# Patient Record
Sex: Female | Born: 1985 | Race: Black or African American | Hispanic: No | Marital: Single | Smoking: Never smoker
Health system: Southern US, Community
[De-identification: ages and names within clinical notes are randomized; demographics above are authoritative.]

## PROBLEM LIST (undated history)

## (undated) ENCOUNTER — Inpatient Hospital Stay (HOSPITAL_COMMUNITY): Payer: Self-pay

## (undated) DIAGNOSIS — F909 Attention-deficit hyperactivity disorder, unspecified type: Secondary | ICD-10-CM

## (undated) DIAGNOSIS — F419 Anxiety disorder, unspecified: Secondary | ICD-10-CM

## (undated) DIAGNOSIS — S0990XA Unspecified injury of head, initial encounter: Secondary | ICD-10-CM

## (undated) DIAGNOSIS — IMO0002 Reserved for concepts with insufficient information to code with codable children: Secondary | ICD-10-CM

## (undated) DIAGNOSIS — F99 Mental disorder, not otherwise specified: Secondary | ICD-10-CM

## (undated) DIAGNOSIS — D649 Anemia, unspecified: Secondary | ICD-10-CM

## (undated) DIAGNOSIS — F1410 Cocaine abuse, uncomplicated: Secondary | ICD-10-CM

## (undated) DIAGNOSIS — F32A Depression, unspecified: Secondary | ICD-10-CM

## (undated) DIAGNOSIS — F3181 Bipolar II disorder: Secondary | ICD-10-CM

## (undated) DIAGNOSIS — F329 Major depressive disorder, single episode, unspecified: Secondary | ICD-10-CM

## (undated) HISTORY — PX: NO PAST SURGERIES: SHX2092

## (undated) HISTORY — PX: OTHER SURGICAL HISTORY: SHX169

---
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
## 2006-03-26 ENCOUNTER — Emergency Department (HOSPITAL_COMMUNITY): Admission: EM | Admit: 2006-03-26 | Discharge: 2006-03-26 | Payer: Self-pay | Admitting: Emergency Medicine

## 2006-04-13 ENCOUNTER — Emergency Department (HOSPITAL_COMMUNITY): Admission: EM | Admit: 2006-04-13 | Discharge: 2006-04-13 | Payer: Self-pay | Admitting: Emergency Medicine

## 2006-04-24 ENCOUNTER — Emergency Department (HOSPITAL_COMMUNITY): Admission: EM | Admit: 2006-04-24 | Discharge: 2006-04-24 | Payer: Self-pay | Admitting: Emergency Medicine

## 2006-04-25 ENCOUNTER — Emergency Department (HOSPITAL_COMMUNITY): Admission: EM | Admit: 2006-04-25 | Discharge: 2006-04-25 | Payer: Self-pay | Admitting: Emergency Medicine

## 2006-05-03 ENCOUNTER — Ambulatory Visit (HOSPITAL_COMMUNITY): Admission: EM | Admit: 2006-05-03 | Discharge: 2006-05-04 | Payer: Self-pay | Admitting: Emergency Medicine

## 2006-06-01 ENCOUNTER — Emergency Department (HOSPITAL_COMMUNITY): Admission: EM | Admit: 2006-06-01 | Discharge: 2006-06-01 | Payer: Self-pay | Admitting: Emergency Medicine

## 2006-07-03 ENCOUNTER — Ambulatory Visit (HOSPITAL_COMMUNITY): Admission: RE | Admit: 2006-07-03 | Discharge: 2006-07-03 | Payer: Self-pay | Admitting: Obstetrics and Gynecology

## 2006-07-10 ENCOUNTER — Ambulatory Visit (HOSPITAL_COMMUNITY): Admission: AD | Admit: 2006-07-10 | Discharge: 2006-07-10 | Payer: Self-pay | Admitting: Obstetrics and Gynecology

## 2006-08-01 ENCOUNTER — Ambulatory Visit (HOSPITAL_COMMUNITY): Admission: EM | Admit: 2006-08-01 | Discharge: 2006-08-01 | Payer: Self-pay | Admitting: Obstetrics and Gynecology

## 2006-09-05 ENCOUNTER — Ambulatory Visit (HOSPITAL_COMMUNITY): Admission: EM | Admit: 2006-09-05 | Discharge: 2006-09-05 | Payer: Self-pay | Admitting: Obstetrics and Gynecology

## 2006-09-27 ENCOUNTER — Observation Stay (HOSPITAL_COMMUNITY): Admission: AD | Admit: 2006-09-27 | Discharge: 2006-09-27 | Payer: Self-pay | Admitting: Obstetrics and Gynecology

## 2006-09-28 ENCOUNTER — Emergency Department (HOSPITAL_COMMUNITY): Admission: EM | Admit: 2006-09-28 | Discharge: 2006-09-28 | Payer: Self-pay | Admitting: Emergency Medicine

## 2006-09-30 ENCOUNTER — Inpatient Hospital Stay (HOSPITAL_COMMUNITY): Admission: AD | Admit: 2006-09-30 | Discharge: 2006-09-30 | Payer: Self-pay | Admitting: Obstetrics & Gynecology

## 2006-09-30 ENCOUNTER — Ambulatory Visit: Payer: Self-pay | Admitting: *Deleted

## 2006-10-01 ENCOUNTER — Ambulatory Visit (HOSPITAL_COMMUNITY): Admission: AD | Admit: 2006-10-01 | Discharge: 2006-10-02 | Payer: Self-pay | Admitting: Obstetrics and Gynecology

## 2006-10-18 ENCOUNTER — Ambulatory Visit (HOSPITAL_COMMUNITY): Admission: AD | Admit: 2006-10-18 | Discharge: 2006-10-18 | Payer: Self-pay | Admitting: Obstetrics and Gynecology

## 2006-10-21 ENCOUNTER — Ambulatory Visit (HOSPITAL_COMMUNITY): Admission: RE | Admit: 2006-10-21 | Discharge: 2006-10-21 | Payer: Self-pay | Admitting: Emergency Medicine

## 2007-02-18 ENCOUNTER — Inpatient Hospital Stay (HOSPITAL_COMMUNITY): Admission: AD | Admit: 2007-02-18 | Discharge: 2007-02-18 | Payer: Self-pay | Admitting: Obstetrics & Gynecology

## 2007-03-08 ENCOUNTER — Inpatient Hospital Stay (HOSPITAL_COMMUNITY): Admission: AD | Admit: 2007-03-08 | Discharge: 2007-03-08 | Payer: Self-pay | Admitting: Gynecology

## 2007-03-12 ENCOUNTER — Inpatient Hospital Stay (HOSPITAL_COMMUNITY): Admission: AD | Admit: 2007-03-12 | Discharge: 2007-03-12 | Payer: Self-pay | Admitting: Gynecology

## 2007-04-02 ENCOUNTER — Inpatient Hospital Stay (HOSPITAL_COMMUNITY): Admission: AD | Admit: 2007-04-02 | Discharge: 2007-04-02 | Payer: Self-pay | Admitting: Family Medicine

## 2007-05-13 ENCOUNTER — Inpatient Hospital Stay (HOSPITAL_COMMUNITY): Admission: AD | Admit: 2007-05-13 | Discharge: 2007-05-13 | Payer: Self-pay | Admitting: Obstetrics & Gynecology

## 2007-06-30 ENCOUNTER — Emergency Department (HOSPITAL_COMMUNITY): Admission: EM | Admit: 2007-06-30 | Discharge: 2007-06-30 | Payer: Self-pay | Admitting: Emergency Medicine

## 2007-07-19 ENCOUNTER — Inpatient Hospital Stay (HOSPITAL_COMMUNITY): Admission: AD | Admit: 2007-07-19 | Discharge: 2007-07-19 | Payer: Self-pay | Admitting: Obstetrics

## 2007-09-11 ENCOUNTER — Inpatient Hospital Stay (HOSPITAL_COMMUNITY): Admission: AD | Admit: 2007-09-11 | Discharge: 2007-09-11 | Payer: Self-pay | Admitting: Obstetrics

## 2007-09-16 ENCOUNTER — Inpatient Hospital Stay (HOSPITAL_COMMUNITY): Admission: AD | Admit: 2007-09-16 | Discharge: 2007-09-18 | Payer: Self-pay | Admitting: Obstetrics

## 2007-11-25 ENCOUNTER — Emergency Department (HOSPITAL_COMMUNITY): Admission: EM | Admit: 2007-11-25 | Discharge: 2007-11-25 | Payer: Self-pay | Admitting: Family Medicine

## 2007-12-30 ENCOUNTER — Inpatient Hospital Stay (HOSPITAL_COMMUNITY): Admission: AD | Admit: 2007-12-30 | Discharge: 2007-12-30 | Payer: Self-pay | Admitting: Obstetrics

## 2008-04-27 ENCOUNTER — Inpatient Hospital Stay (HOSPITAL_COMMUNITY): Admission: AD | Admit: 2008-04-27 | Discharge: 2008-04-27 | Payer: Self-pay | Admitting: Obstetrics

## 2008-05-29 ENCOUNTER — Emergency Department (HOSPITAL_COMMUNITY): Admission: EM | Admit: 2008-05-29 | Discharge: 2008-05-29 | Payer: Self-pay | Admitting: *Deleted

## 2008-05-30 ENCOUNTER — Emergency Department (HOSPITAL_COMMUNITY): Admission: EM | Admit: 2008-05-30 | Discharge: 2008-05-30 | Payer: Self-pay | Admitting: *Deleted

## 2008-06-10 ENCOUNTER — Observation Stay (HOSPITAL_COMMUNITY): Admission: AD | Admit: 2008-06-10 | Discharge: 2008-06-11 | Payer: Self-pay | Admitting: Obstetrics

## 2008-06-27 ENCOUNTER — Inpatient Hospital Stay (HOSPITAL_COMMUNITY): Admission: AD | Admit: 2008-06-27 | Discharge: 2008-06-27 | Payer: Self-pay | Admitting: Obstetrics

## 2008-07-01 ENCOUNTER — Inpatient Hospital Stay (HOSPITAL_COMMUNITY): Admission: AD | Admit: 2008-07-01 | Discharge: 2008-07-01 | Payer: Self-pay | Admitting: Obstetrics & Gynecology

## 2008-07-07 ENCOUNTER — Ambulatory Visit: Payer: Self-pay | Admitting: Obstetrics and Gynecology

## 2008-07-07 ENCOUNTER — Inpatient Hospital Stay (HOSPITAL_COMMUNITY): Admission: AD | Admit: 2008-07-07 | Discharge: 2008-07-07 | Payer: Self-pay | Admitting: Obstetrics & Gynecology

## 2008-07-09 ENCOUNTER — Ambulatory Visit: Payer: Self-pay | Admitting: Obstetrics & Gynecology

## 2008-07-12 ENCOUNTER — Inpatient Hospital Stay (HOSPITAL_COMMUNITY): Admission: AD | Admit: 2008-07-12 | Discharge: 2008-07-14 | Payer: Self-pay | Admitting: Obstetrics and Gynecology

## 2008-07-12 ENCOUNTER — Ambulatory Visit: Payer: Self-pay | Admitting: Obstetrics and Gynecology

## 2008-08-15 ENCOUNTER — Emergency Department (HOSPITAL_COMMUNITY): Admission: EM | Admit: 2008-08-15 | Discharge: 2008-08-15 | Payer: Self-pay | Admitting: Emergency Medicine

## 2008-09-10 ENCOUNTER — Emergency Department (HOSPITAL_COMMUNITY): Admission: EM | Admit: 2008-09-10 | Discharge: 2008-09-11 | Payer: Self-pay | Admitting: Emergency Medicine

## 2009-02-05 ENCOUNTER — Inpatient Hospital Stay (HOSPITAL_COMMUNITY): Admission: AD | Admit: 2009-02-05 | Discharge: 2009-02-05 | Payer: Self-pay | Admitting: Obstetrics & Gynecology

## 2009-09-15 ENCOUNTER — Ambulatory Visit: Payer: Self-pay | Admitting: Obstetrics and Gynecology

## 2009-09-15 ENCOUNTER — Inpatient Hospital Stay (HOSPITAL_COMMUNITY): Admission: AD | Admit: 2009-09-15 | Discharge: 2009-09-15 | Payer: Self-pay | Admitting: Obstetrics & Gynecology

## 2009-10-13 ENCOUNTER — Inpatient Hospital Stay (HOSPITAL_COMMUNITY): Admission: AD | Admit: 2009-10-13 | Discharge: 2009-10-13 | Payer: Self-pay | Admitting: Obstetrics and Gynecology

## 2009-10-14 IMAGING — US US OB FOLLOW-UP
1 series · 14 of 28 positions shown · non-contrast
Comparison: none

OBSTETRICAL ULTRASOUND:
 This ultrasound exam was performed in the [HOSPITAL] Ultrasound Department.  The OB US report was generated in the AS system, and faxed to the ordering physician.  This report is also available in [REDACTED] PACS.

[Series 1: us ob follow-up · 0.33mm/px · 28 acquisitions, 14 frames shown]
[im 2/28]
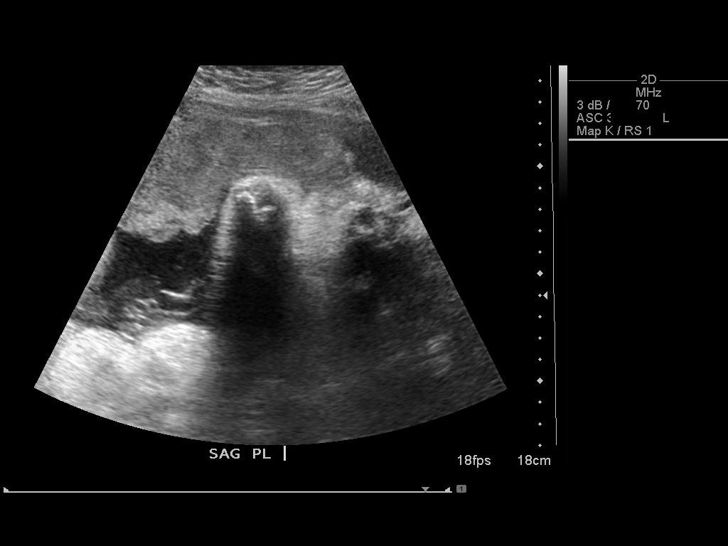
[im 4/28]
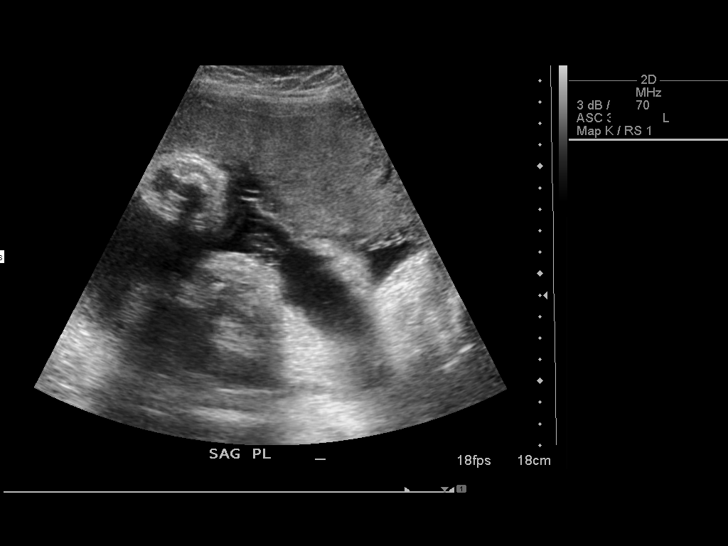
[im 6/28]
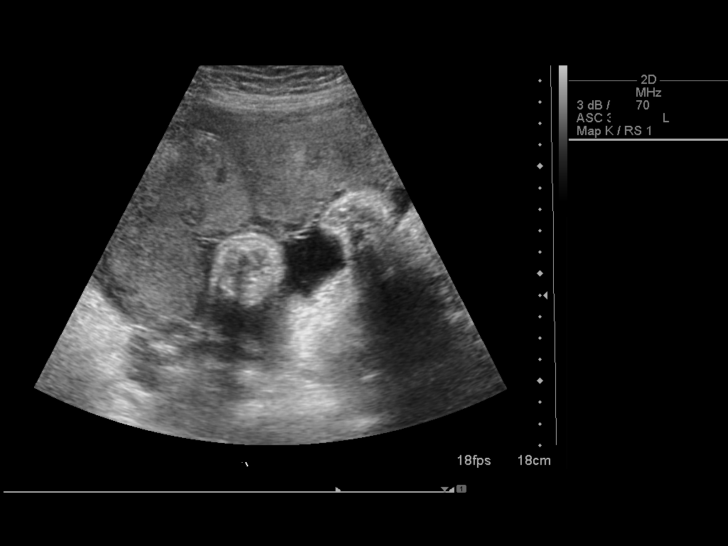
[im 8/28]
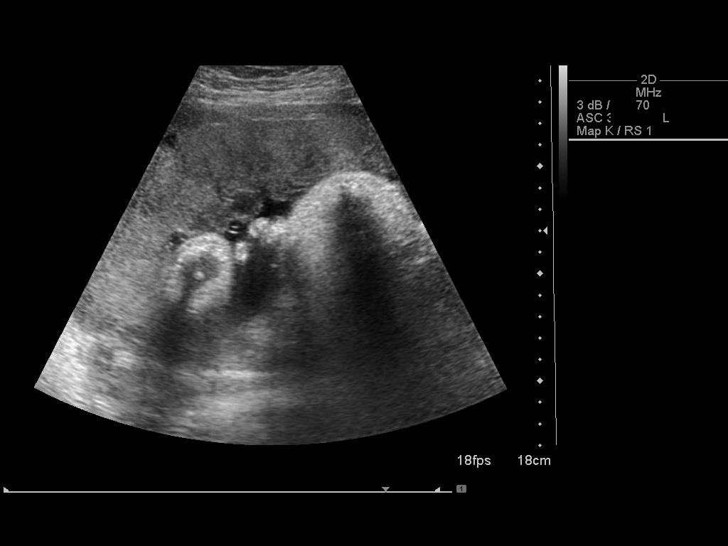
[im 10/28]
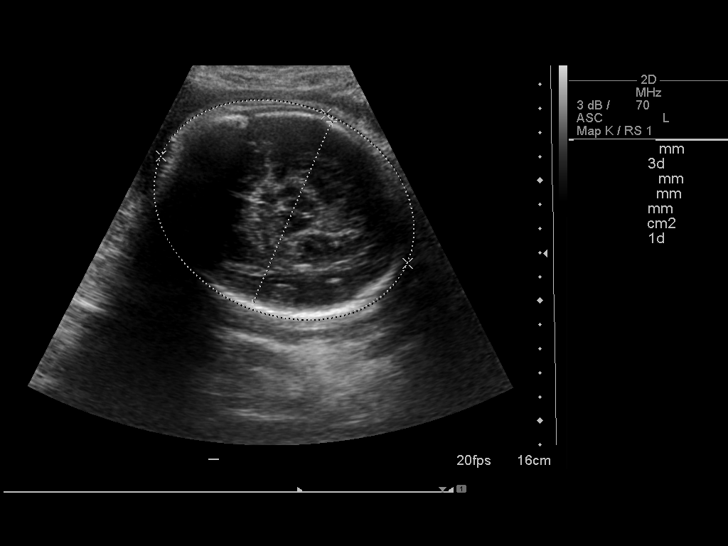
[im 12/28]
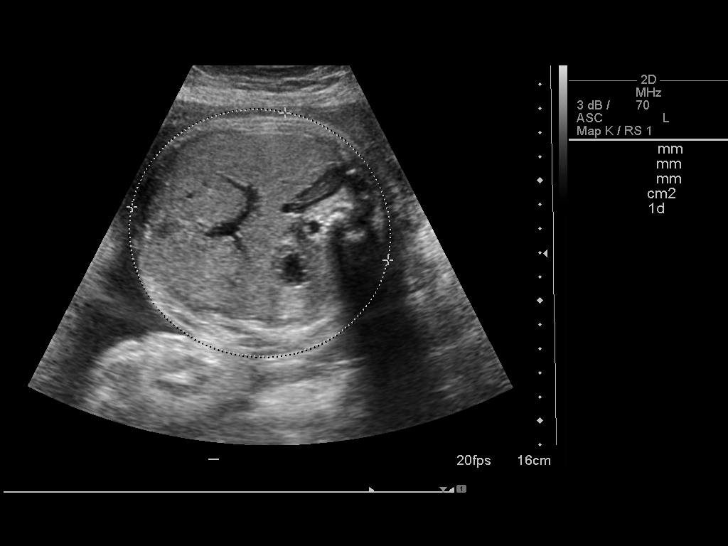
[im 14/28]
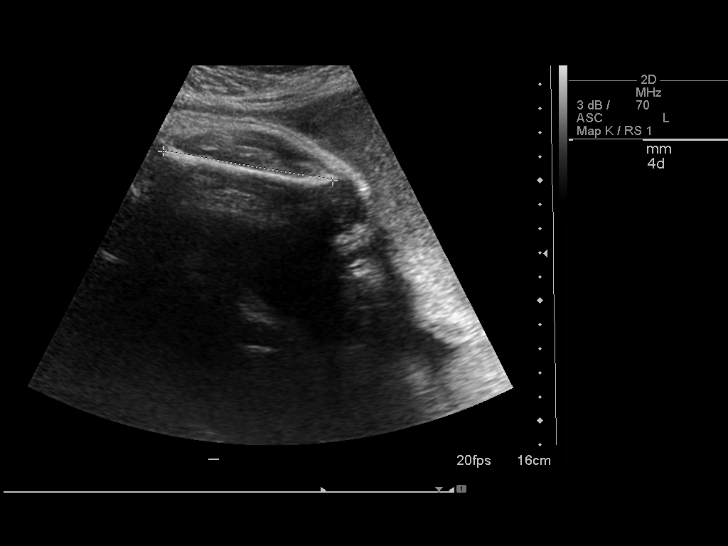
[im 16/28]
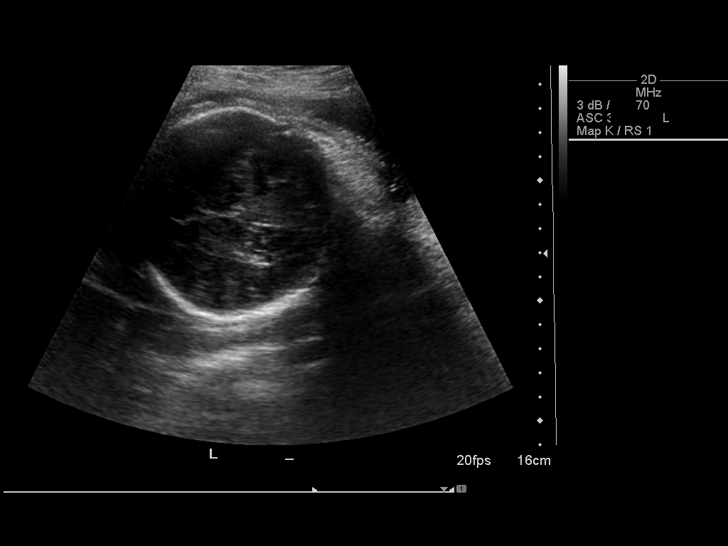
[im 18/28]
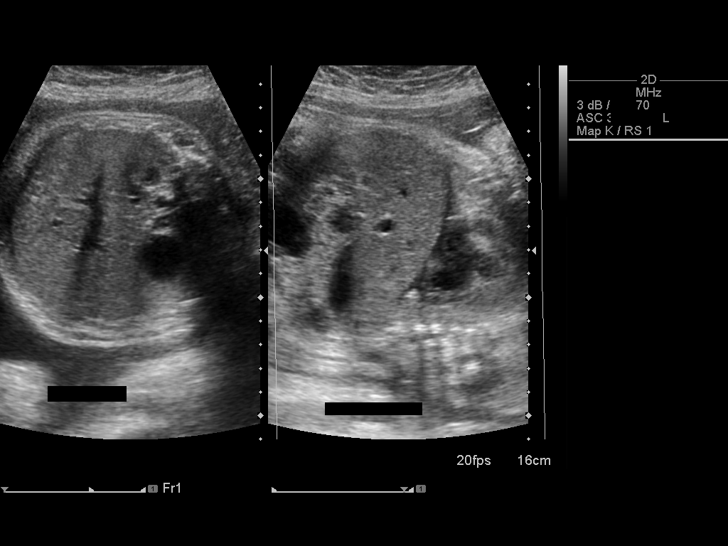
[im 20/28]
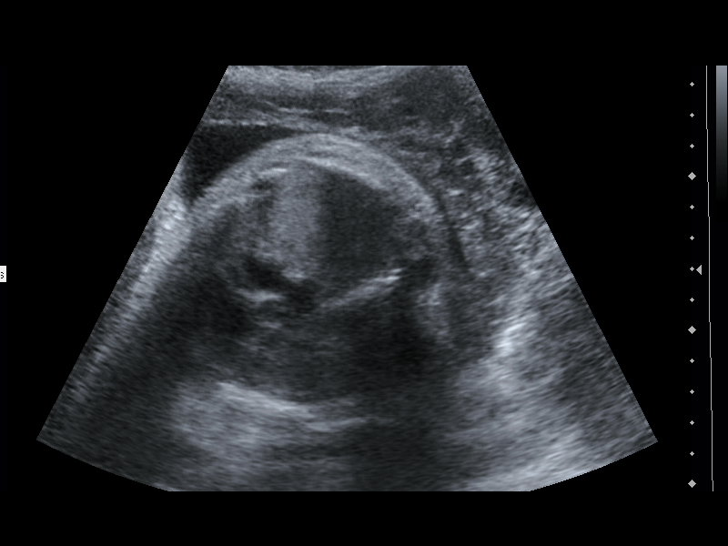
[im 22/28]
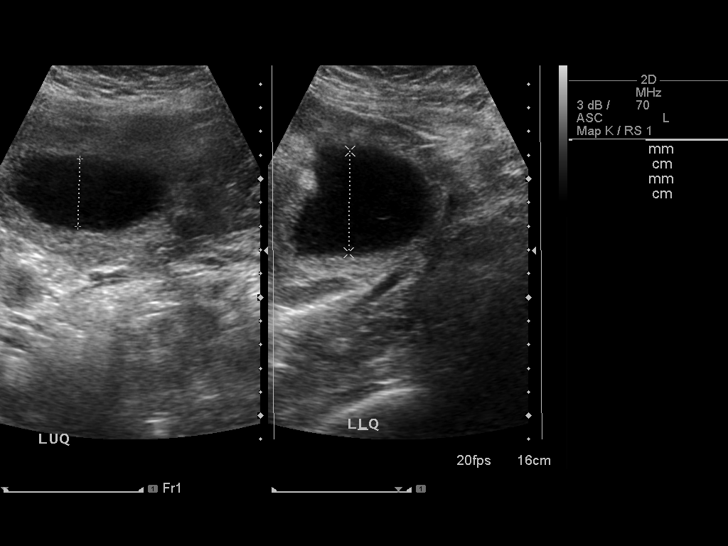
[im 24/28]
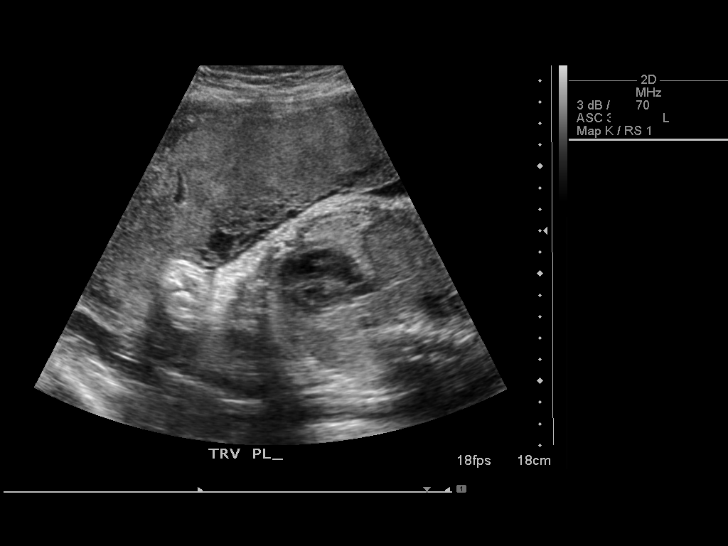
[im 26/28]
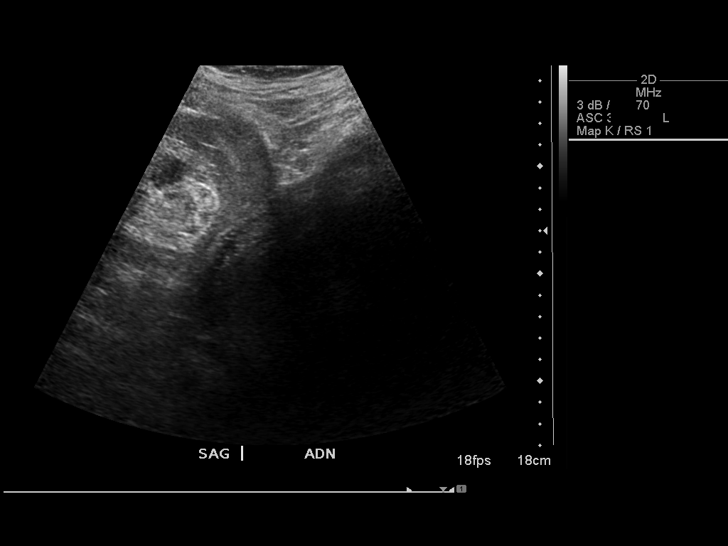
[im 28/28]
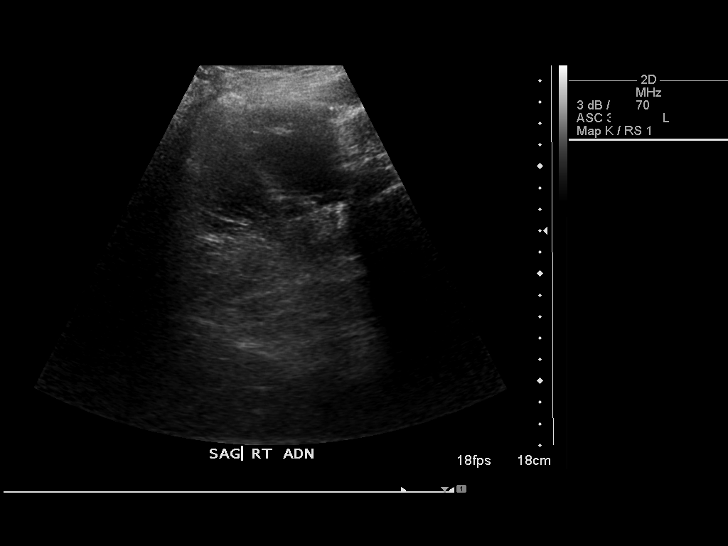

[14 of 28 positions shown; findings below may reference images not displayed]

IMPRESSION: See AS Obstetric US report.

## 2010-10-02 NOTE — L&D Delivery Note (Signed)
Delivery Note At 1:58 AM a viable and healthy female was delivered via Vaginal, Spontaneous Delivery (Presentation: Right Occiput Anterior).  APGAR: 8, 9; weight 8 lb 15.9 oz (4080 g).   Placenta status: Intact, Spontaneous.  Cord: 3 vessels with the following complications: None.    Anesthesia: Epidural  Episiotomy: None Lacerations: None Est. Blood Loss (mL): 350cc  Mom to postpartum.  Baby to nursery-stable. Plans pp BTS  Katheen Aslin E. 05/01/2011, 2:17 AM

## 2010-11-01 ENCOUNTER — Inpatient Hospital Stay (HOSPITAL_COMMUNITY)
Admission: AD | Admit: 2010-11-01 | Discharge: 2010-11-01 | Payer: Self-pay | Source: Home / Self Care | Attending: Family Medicine | Admitting: Family Medicine

## 2010-11-01 DIAGNOSIS — O9989 Other specified diseases and conditions complicating pregnancy, childbirth and the puerperium: Secondary | ICD-10-CM

## 2010-11-01 DIAGNOSIS — R109 Unspecified abdominal pain: Secondary | ICD-10-CM

## 2010-11-01 LAB — URINALYSIS, ROUTINE W REFLEX MICROSCOPIC
Hgb urine dipstick: NEGATIVE
Protein, ur: NEGATIVE mg/dL
Urobilinogen, UA: 0.2 mg/dL (ref 0.0–1.0)

## 2010-11-01 LAB — WET PREP, GENITAL: Yeast Wet Prep HPF POC: NONE SEEN

## 2010-11-03 LAB — GC/CHLAMYDIA PROBE AMP, GENITAL
Chlamydia, DNA Probe: POSITIVE — AB
GC Probe Amp, Genital: POSITIVE — AB

## 2010-12-18 LAB — URINALYSIS, ROUTINE W REFLEX MICROSCOPIC
Protein, ur: NEGATIVE mg/dL
Urobilinogen, UA: 0.2 mg/dL (ref 0.0–1.0)

## 2010-12-18 LAB — CBC
Platelets: 251 10*3/uL (ref 150–400)
RBC: 3.98 MIL/uL (ref 3.87–5.11)
WBC: 11.3 10*3/uL — ABNORMAL HIGH (ref 4.0–10.5)

## 2010-12-18 LAB — WET PREP, GENITAL
Clue Cells Wet Prep HPF POC: NONE SEEN
Trich, Wet Prep: NONE SEEN

## 2010-12-18 LAB — DIFFERENTIAL
Lymphocytes Relative: 17 % (ref 12–46)
Lymphs Abs: 1.9 10*3/uL (ref 0.7–4.0)
Neutrophils Relative %: 74 % (ref 43–77)

## 2010-12-18 LAB — URINE MICROSCOPIC-ADD ON

## 2010-12-18 LAB — GC/CHLAMYDIA PROBE AMP, GENITAL: Chlamydia, DNA Probe: NEGATIVE

## 2011-01-02 ENCOUNTER — Inpatient Hospital Stay (HOSPITAL_COMMUNITY): Payer: Self-pay

## 2011-01-02 ENCOUNTER — Inpatient Hospital Stay (HOSPITAL_COMMUNITY)
Admission: AD | Admit: 2011-01-02 | Discharge: 2011-01-03 | Disposition: A | Discharge status: Home / Self Care | Payer: Self-pay | Source: Ambulatory Visit | Attending: Obstetrics and Gynecology | Admitting: Obstetrics and Gynecology

## 2011-01-02 DIAGNOSIS — O2390 Unspecified genitourinary tract infection in pregnancy, unspecified trimester: Secondary | ICD-10-CM

## 2011-01-02 DIAGNOSIS — A499 Bacterial infection, unspecified: Secondary | ICD-10-CM | POA: Insufficient documentation

## 2011-01-02 DIAGNOSIS — O4690 Antepartum hemorrhage, unspecified, unspecified trimester: Secondary | ICD-10-CM

## 2011-01-02 DIAGNOSIS — N760 Acute vaginitis: Secondary | ICD-10-CM | POA: Insufficient documentation

## 2011-01-02 DIAGNOSIS — B9689 Other specified bacterial agents as the cause of diseases classified elsewhere: Secondary | ICD-10-CM | POA: Insufficient documentation

## 2011-01-03 LAB — RUBELLA SCREEN: Rubella: 25.1 IU/mL — ABNORMAL HIGH

## 2011-01-03 LAB — DIFFERENTIAL
Basophils Absolute: 0 10*3/uL (ref 0.0–0.1)
Lymphocytes Relative: 19 % (ref 12–46)
Monocytes Absolute: 0.7 10*3/uL (ref 0.1–1.0)
Neutro Abs: 8 10*3/uL — ABNORMAL HIGH (ref 1.7–7.7)

## 2011-01-03 LAB — CBC
Hemoglobin: 11.7 g/dL — ABNORMAL LOW (ref 12.0–15.0)
RDW: 13.8 % (ref 11.5–15.5)

## 2011-01-03 LAB — URINALYSIS, ROUTINE W REFLEX MICROSCOPIC
Nitrite: NEGATIVE
Specific Gravity, Urine: 1.025 (ref 1.005–1.030)
Urobilinogen, UA: 0.2 mg/dL (ref 0.0–1.0)

## 2011-01-03 LAB — RAPID URINE DRUG SCREEN, HOSP PERFORMED
Amphetamines: NOT DETECTED
Opiates: NOT DETECTED
Tetrahydrocannabinol: NOT DETECTED

## 2011-01-03 LAB — HEPATITIS B SURFACE ANTIGEN: Hepatitis B Surface Ag: NEGATIVE

## 2011-01-03 LAB — GC/CHLAMYDIA PROBE AMP, GENITAL
Chlamydia, DNA Probe: NEGATIVE
GC Probe Amp, Genital: NEGATIVE

## 2011-01-03 LAB — HIV ANTIBODY (ROUTINE TESTING W REFLEX): HIV: NONREACTIVE

## 2011-01-03 LAB — URINE MICROSCOPIC-ADD ON

## 2011-01-03 LAB — WET PREP, GENITAL: Yeast Wet Prep HPF POC: NONE SEEN

## 2011-01-03 LAB — RPR: RPR Ser Ql: NONREACTIVE

## 2011-01-03 LAB — URINE CULTURE

## 2011-01-09 LAB — ABO/RH: RH Type: POSITIVE

## 2011-01-09 LAB — TYPE AND SCREEN: Antibody Screen: NEGATIVE

## 2011-01-09 LAB — VARICELLA ZOSTER ANTIBODY, IGG

## 2011-01-09 LAB — CBC: Hemoglobin: 12 g/dL (ref 12.0–16.0)

## 2011-01-09 LAB — HIV ANTIBODY (ROUTINE TESTING W REFLEX): HIV: NONREACTIVE

## 2011-02-17 NOTE — Group Therapy Note (Signed)
NAME:  Regina Coleman, SOKOL NO.:  [id number]   MEDICAL RECORD NO.:  [id number]          PATIENT TYPE:  OIB   LOCATION:  LDR1                          FACILITY:  [facility]   PHYSICIAN:  Tilda Burrow, M.D. DATE OF BIRTH:  1986-07-10   DATE OF PROCEDURE:  DATE OF DISCHARGE:  07/10/2006                                   PROGRESS NOTE   [name] is about [redacted] weeks pregnant with her third child, called the office  said she was having a lot of cramping despite drinking plenty of fluids.  She was sent to labor and delivery for evaluation.  She denies using any  cocaine recently, says she did smoke marijuana yesterday and states that it  had to have been laced with cocaine.  However, she did not have any THC in  her urinalysis and this was all brought to the patient's attention yet she  still denies using cocaine knowingly.  Regardless of how it got into her  system, I discussed the implications it can have including placental  abruption with possible death of her and/or her baby.  [name] is  already in a four day a week outpatient rehab program through [facility].  She has an appointment this afternoon.  Her second  child was removed from the home about a month ago for drug use.  Her first  child has also been removed from the home.  We discussed the likelihood of  this baby also being removed from the home if she did not straighten herself  up.  [name] an understanding of all of this.  Her cervix is  firm, long, thick and closed.  No uterine activity is seen and we got a good  tracing on the fetal heart rate.   IMPRESSION:  Intrauterine pregnancy at 22-weeks, positive cocaine abuse,  fetal heart rate within normal limits.  No irritability seen.      [name], C.N.M.      Tilda Burrow, M.D.  Electronically Signed    FC/MEDQ  D:  07/10/2006  T:  07/11/2006  Job:  [id number]   cc:   [facility]  [address] [place], [place] [address]  [place]

## 2011-02-17 NOTE — Op Note (Signed)
NAME:  Regina Coleman, Regina Coleman NO.:  [id number]   MEDICAL RECORD NO.:  [id number]          PATIENT TYPE:  INP   LOCATION:  A401                          FACILITY:  [facility]   PHYSICIAN:  Tilda Burrow, M.D. DATE OF BIRTH:  01-Apr-1986   DATE OF PROCEDURE:  DATE OF DISCHARGE:  12/01/2004                                 OPERATIVE REPORT   PROCEDURE:  Epidural catheter placement.   The patient progressed in labor to active phase labor and requested an  epidural. She was placed in a sitting position, flexed forward, and loss of  resistance technique used after 500 cc bolus. The patient had 1.5% lidocaine  with epinephrine used. This was test dose at 8:37 a.m. on November 29, 2004  followed by 10 cc bolus of epidural mix. We then continued at 12 cc per hour  with continuous infusion. The patient had had sensory deficit changes and  has symmetrical analgesic effect. She progressed nicely to completely  dilated shortly thereafter.      JVF/MEDQ  D:  12/21/2004  T:  12/22/2004  Job:  [id number]

## 2011-02-17 NOTE — Consult Note (Signed)
NAME:  Regina Coleman, Regina Coleman NO.:  [id number]   MEDICAL RECORD NO.:  [id number]          PATIENT TYPE:  OIB   LOCATION:  A415                          FACILITY:  [facility]   PHYSICIAN:  Tilda Burrow, M.D. DATE OF BIRTH:  10-24-1985   DATE OF CONSULTATION:  10/21/2006  DATE OF DISCHARGE:  10/21/2006                                 CONSULTATION   PROCEDURE:  Observation to rule out labor.   [name] presents at 10 a.m. complaining that she is not sure she needs  to get checked but she is uncomfortable.  She has no gush of fluid or  bleeding.  She claims she has been straight, without any use of illicit  substances.  She has an appointment in 2 days with her mental health  counselor.  She is requesting that she have a residence option for  pregnancy.  The plan today is to monitor.  External monitor shows  reactive pattern.  Cervical exam shows the cervix to be 1-2 cm but very  posterior beneath the low presenting part in the vertex.   IMPRESSION:  Physiologic discomforts of late pregnancy.   Routine followup in our office.  Note, we need to confirm that a proof  of cure has been performed on GC, Chl vaginal cultures.      Tilda Burrow, M.D.  Electronically Signed     JVF/MEDQ  D:  10/21/2006  T:  10/21/2006  Job:  [id number]
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
## 2011-02-17 NOTE — Op Note (Signed)
NAME:  LIDIE, GLADE NO.:  [id number]   MEDICAL RECORD NO.:  [id number]          PATIENT TYPE:  INP   LOCATION:  A401                          FACILITY:  [facility]   PHYSICIAN:  Tilda Burrow, M.D. DATE OF BIRTH:  1986/08/25   DATE OF PROCEDURE:  11/29/2004  DATE OF DISCHARGE:                                 OPERATIVE REPORT   DELIVERY NOTE   Immediately after receiving her epidural, Myrtle was noted to be 10 cm  dilated at +2 station. Right prior to her epidural, she had had a  spontaneous rupture of membranes with clear fluid. Zelene pushed for  approximately 45-50 minutes, delivered a viable female infant at 31. The  weight was 6 pounds 4 ounces. Apgars are 9 and 9. Twenty units of Pitocin  diluted in 1,000 cc in lactated ringers was then infused rapidly IV. The  placenta separated spontaneously and delivered via controlled cord traction  at 0950. It was inspected and appears to be intact with a three vessel cord.  The fundus was firm. Minimal blood loss was noted. The vagina was then  inspected, and no lacerations were found. Estimated blood loss 200 cc. The  epidural catheter was then removed, and the blue tip was visualized as being  intact.      JVF/MEDQ  D:  11/29/2004  T:  11/29/2004  Job:  [id number]

## 2011-02-17 NOTE — H&P (Signed)
NAME:  Regina, Coleman NO.:  [id number]   MEDICAL RECORD NO.:  [id number]          PATIENT TYPE:  OIB   LOCATION:  A415                          FACILITY:  [facility]   PHYSICIAN:  Tilda Burrow, M.D. DATE OF BIRTH:  08-12-1986   DATE OF ADMISSION:  08/01/2006  DATE OF DISCHARGE:  LH                                HISTORY & PHYSICAL   HISTORY OF PRESENT ILLNESS:  Regina Coleman is a 25 year old gravida 5, para 2,  abortus 2, who is at [redacted] weeks gestation with sharp, shooting, abdominal  pains.   PAST MEDICAL HISTORY:  Positive for substance abuse.   PAST SURGICAL HISTORY:  Negative.   Also, she has had a psychiatric and diagnosed with bipolar.   FAMILY HISTORY:  Positive for hypertension.   Her prenatal course has been complicated by chronic drug use and positive  Chlamydia which the patient was treated for.  Blood type is A positive.  UDS  is positive for cocaine.  Rubella is immune.  Hepatitis B surface antigen is  negative.  HIV is negative.  Repeat GC and Chlamydia are pending.   PHYSICAL EXAMINATION:  Vital signs are stable.  Fetal heart rate pattern is  stable.  Cervix is firm, long, posterior, and high.  With careful  observation, the patient is noted with fetal movement the patient does have  discomfort.  Also, UDS was obtained and urine is positive for cocaine.  A  lengthy discussion for the second time with Tyesha in regards to cocaine  use and its affects in pregnancy.  She assures me that after today her UDS  will be negative.   PLAN:  We are going to discharge her home to follow up here as needed.  Again, she was counseled for the second time in regards to her drug use.      Zerita Boers, Lanier Clam      Tilda Burrow, M.D.  Electronically Signed    DL/MEDQ  D:  78/29/5621  T:  08/01/2006  Job:  [id number]

## 2011-02-17 NOTE — H&P (Signed)
NAME:  PAMULA, LUTHER NO.:  [id number]   MEDICAL RECORD NO.:  [id number]          PATIENT TYPE:  OIB   LOCATION:  LDR1                          FACILITY:  [facility]   PHYSICIAN:  Tilda Burrow, M.D. DATE OF BIRTH:  October 10, 1985   DATE OF ADMISSION:  11/28/2004  DATE OF DISCHARGE:  LH                                HISTORY & PHYSICAL   OBSERVATION NOTE:   TIME OF ADMISSION:  1320 hours.   Auden is a 25 year old, gravida 2, para 0.  EDC is December 06, 2004.   REASON FOR ADMISSION:  Tewana is a 25 year old, gravida 2, para 0 at 66  weeks pregnancy with complaint of leaking membranes.  Upon admission to the  hospital, she was having no contractions.  The fetal heart rate pattern is  stable.  She is not leaking any fluid upon examination.  Nitrazine is  negative.   MEDICAL HISTORY:  Negative.   SURGICAL HISTORY:  Negative.   ALLERGIES:  There are no known allergies.   MEDICATIONS:  Prenatal vitamins and Valtrex 500 mg p.o. daily for HSV-2  prophylaxis.   FAMILY HISTORY:  Positive for hypertension.   PRENATAL COURSE:  Blood type is A positive.  Rubella is immune.  Hepatitis B  surface antigen is negative.  HIV is negative.  Positive HSV-2.  Serologies  nonreactive.  GC and chlamydia are negative.  GBS is negative.  Sickle cell  screen is negative.  The 28-week hemoglobin was 11 and 28-week hematocrit  35.4.  The one-hour glucose was 90.   PHYSICAL EXAMINATION:  Vital signs stable.  The fetal heart tone is stable.  There are no contractions on the monitor.  Sterile speculum exam reveals no  leaking with visualization of the cervix and cough.  Nitrazine is negative.  Fern test is negative.  There is no active leaking with cough or bearing  down.  The cervix is 170, -1 and -2.  The presenting parts ballot.   PLAN:  We are going to discharge home with reactive nonstress test.  Also,  after discussing her medication in regards to whether she is being compliant  with her Valtrex, she said she has not taken it in three days.  I discussed  with her the importance of taking her Valtrex and being compliant.  There  are no lesions upon inspection.  I did give her today's dose of Valtrex here  at the hospital and encouraged her to be compliant with her medication  regimen.      DL/MEDQ  D:  81/19/1478  T:  11/28/2004  Job:  [id number]   cc:   [facility]

## 2011-02-17 NOTE — H&P (Signed)
NAME:  Regina Coleman, Regina Coleman             ACCOUNT NO.:  [id number]   MEDICAL RECORD NO.:  [id number]          PATIENT TYPE:  OIB   LOCATION:  A415                          FACILITY:  [facility]   PHYSICIAN:  Tilda Burrow, M.D. DATE OF BIRTH:  Nov 10, 1985   DATE OF ADMISSION:  07/03/2006  DATE OF DISCHARGE:  LH                                HISTORY & PHYSICAL   OBSERVATION ADMISSION:   REASON FOR ADMISSION:  Pregnancy at 22 weeks with uterine irritability.   Quina presents at 4 a.m. with some mild cramping. Urine shows dehydration  with ketones present. After adequate hydration, the patient's contractions  abated and the patient went to sleep. The cervix is closed, firm, posterior  and high.   PLAN:  We are going to discharge her home. I gave her information and  encouraged her to drink plenty of fluids that dehydration would cause  cramping. She was to followup in the office with [place] one day this week.      Zerita Boers, Lanier Clam      Tilda Burrow, M.D.  Electronically Signed    DL/MEDQ  D:  04/54/0981  T:  07/03/2006  Job:  [id number]

## 2011-02-17 NOTE — H&P (Signed)
NAME:  Regina Coleman, VAZQUES NO.:  [id number]   MEDICAL RECORD NO.:  [id number]          PATIENT TYPE:  INP   LOCATION:  LDR1                          FACILITY:  [facility]   PHYSICIAN:  Tilda Burrow, M.D. DATE OF BIRTH:  Oct 28, 1985   DATE OF ADMISSION:  11/28/2004  DATE OF DISCHARGE:  LH                                HISTORY & PHYSICAL   ADMISSION DIAGNOSIS:  Pregnancy at 39 weeks for elective induction of labor  at the patient's request.   After thoroughly discussing options versus benefits versus risks of  induction with the patient and the patient's mother, the patient desires  elective induction of labor due to maternal discomfort and inability to  sleep.   MEDICAL HISTORY:  Negative.   SURGICAL HISTORY:  Negative.   ALLERGIES:  She has no known allergies.   MEDICATIONS:  She is on prenatal vitamins and Valtrex, although she does  admit for the past several days that she has not taken her Valtrex.  She was  given a dose today in labor and delivery.   SOCIAL HISTORY:  She lives with her grandmother.   FAMILY HISTORY:  Positive for hypertension.   Prenatal course has been essentially uneventful, except she is positive for  HSV2.  Blood type is A positive.  UDS is negative.  Rubella is immune.  Hepatitis B surface antigen negative.  HIV negative.  HSV2 is positive.  Serology is nonreactive.  GC and chlamydia are negative.  GBS is negative.  The 28-week hemoglobin was 11.0 and the 28-week hematocrit was 35.9.  The  one-hour glucose was 96.  Sickle cell screen is negative.   PHYSICAL EXAMINATION:  On physical exam today, the vital signs are stable.  The cervix is 1 cm, 70% effaced and -1 to -2 station.   After discussing the plan of care with Dr. Despina Hidden, it was felt that  __prepidil_ would be an appropriate plan of care for the patient in regards  to inducing labor.  Plan to admit, use __________ for induction and start  Pitocin at 4 a.m.     DL/MEDQ  D:   04/54/0981  T:  11/28/2004  Job:  [id number]

## 2011-02-17 NOTE — Consult Note (Signed)
NAME:  Regina Coleman, Regina Coleman NO.:  [id number]   MEDICAL RECORD NO.:  [id number]          PATIENT TYPE:  OBV   LOCATION:  LDR3                          FACILITY:  [facility]   PHYSICIAN:  Tilda Burrow, M.D. DATE OF BIRTH:  January 04, 1986   DATE OF CONSULTATION:  DATE OF DISCHARGE:  09/27/2006                                 CONSULTATION   CHIEF COMPLAINT:  Lower abdominal discomfort, suprapubic area.   HISTORY OF PRESENT ILLNESS:  This 25 year old female, gravida 5, para 2,  AB 2, two living children currently in the custody of her aunt, is seen  in labor and delivery complaining of suprapubic pain and discomfort.  The pregnancy course has been fragmented with missed appointments and  travel to [place] and to [place].  Patient has had no bleeding or  gush of fluids.  External monitoring shows a reactive pattern with no  decelerations.  Cervix is 1 cm long and high with a thinning part  ballotable by initial nursing assessment.  She is afebrile.   Assessment so far shows urine drug screen positive for cocaine once  again with glucose present as well.  There is no evidence of urinary  tract infection with negative nitrate and trace protein on this highly  concentrated specimen.  Leukocyte esterase is indeed trace positive on  the concentrated specimen.   On further review of systems, the patient has recently been retreated  for her second infection of this pregnancy, and she has been abstinent  for four weeks.  Patient has an appointment next week.  At next week's  appointment, we can test for proof of cure.   Further discussion with patient over her cocaine use.  The patient is  concerned over _risk of__ losing her child.  She expresses some interest  in going to a rehab program but does not want to go through any  pregnancy if it will increase her likelihood of losing the child.  She  considered that she may go sometime after the pregnancy.  I made the  point of  telling her that attending a rehab program at this point in her  pregnancy would most likely improve chances that she will be able to  keep her child rather than reduce it.  She is aware that documents of  cocaine use have been made a part of her record twice so far.   IMPRESSION:  1. Nonspecific pain of pregnancy, no evidence of rupture or preterm      labor.  2. Recurrent cocaine use.  3. Recent treated sexually transmitted diseases x2.   PLAN:  Will allow patient to go home on Ambien CR 12.5 mg prior to  discharge.  Follow up in one week with [facility]; however, we will be  considering attempting to help with a rehab program if the patient is  consistent in her expressed interest.   ADDENDUM:  Patient describes herself as bipolar and ADHD with history of  psychiatric commitment in 2005 at [facility].      Tilda Burrow, M.D.  Electronically Signed     JVF/MEDQ  D:  09/27/2006  T:  09/27/2006  Job:  [id number]

## 2011-02-17 NOTE — Discharge Summary (Signed)
NAME:  Regina Coleman, Regina Coleman NO.:  [id number]   MEDICAL RECORD NO.:  [id number]          PATIENT TYPE:  OIB   LOCATION:  A415                          FACILITY:  [facility]   PHYSICIAN:  Tilda Burrow, M.D. DATE OF BIRTH:  04-08-1986   DATE OF ADMISSION:  10/01/2006  DATE OF DISCHARGE:  01/01/2008LH                               DISCHARGE SUMMARY   REASON FOR ADMISSION:  Pregnancy at 34 weeks and 4 days with premature  uterine contractions.   DISCHARGE DIAGNOSIS:  Pregnancy at 34 weeks and 4 days with premature  uterine contractions.   HOSPITAL COURSE:  The patient was given subcutaneous terbutaline and  p.o. terbutaline which abated the uterine contractions after about 1-1/2  hours.  She has rested well during the course of the night with only an  occasional isolated uterine contraction.  I discussed with the patient  the importance of not using street drugs, but the patient has  consistently done cocaine and prescription narcotics that she could get  her hands on throughout the pregnancy.  Also, last night, she refused  her azithromycin.  I had a discussion with her.  She is willing to take  them now.  I am going to discharge her on Valtrex 1 gm p.o. daily and  Brethine 5 mg p.o. q. 4 hours.  On admit when she is in labor,we will  get an HIV, a Hep B and C with her admitting blood work.      Zerita Boers, Lanier Clam      Tilda Burrow, M.D.  Electronically Signed    DL/MEDQ  D:  19/14/7829  T:  10/02/2006  Job:  [id number]   cc:   [facility]

## 2011-02-17 NOTE — H&P (Signed)
NAME:  Regina Coleman, Regina Coleman NO.:  [id number]   MEDICAL RECORD NO.:  [id number]          PATIENT TYPE:  OIB   LOCATION:  A415                          FACILITY:  [facility]   PHYSICIAN:  Tilda Burrow, M.D. DATE OF BIRTH:  [birth year]   DATE OF ADMISSION:  10/01/2006  DATE OF DISCHARGE:  LH                              HISTORY & PHYSICAL   OBSERVATION ADMISSION:   REASON FOR ADMISSION:  Pregnancy at 34 weeks and about 3-4 days with  regular uterine contractions without cervical change on admission since  prior exam the day before.   Medical history is positive for psychiatric problems.  There was  commitment in 2005, diagnosed with bipolar.  Also she has a history of  cocaine and Rx drug abuse.   PAST SURGICAL HISTORY:  Negative.   ALLERGIES:  She has no known allergies.   MEDICATIONS:  Prenatal vitamins.   SOCIAL HISTORY:  She is bisexual.  She has a girlfriend.  She is  estranged from the baby's father at the present time.   FAMILY HISTORY:  Positive for hypertension.   Prenatal course has been complicated by limited prenatal care.  The  patient has had intermittent visits, states that at one time she was in  [place] for a couple of weeks between 09/07 and 12/04.  She has had  several visits since 12/04.   PRENATAL COURSE:  She is positive for Chlamydia, which was treated.  Blood type is A positive.  UDS was positive cocaine.  Rubella is immune.  Hepatitis B surface antigen is negative.  HIV is nonreactive.  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  The patient is going to be given 1 g of Valtrex p.o. here  tonight and she will be sent home with a prescription for Valtrex 1 g  p.o. daily.  Initial Pap was normal.  GC/Chlamydia:  Chlamydia was  positive.  On repeat was positive on the [facility], for which she was treated  on 12/07.  A 28-week hemoglobin was 13.0, 28-week hematocrit was 38.3.  One-hour glucose was 141.   PHYSICAL EXAMINATION:  Vital signs are stable.  She is having  regular  uterine contractions every 3-4 minutes; they are very mild.  Fetal heart  rate is stable at the present time.  Abdomen is soft, gravid,  hypertension and nonrigid.   UDS was obtained, and it is positive for cocaine.  Regular urinalysis is  pending.   PLAN:  We are going to give her subcu terbutaline followed by 5 mg p.o.,  Brethine 5 p.o. q.4h., 1 g of Valtrex p.o., and observe.  If she is  stable, will discharge her home in the morning.      Zerita Boers, Lanier Clam      Tilda Burrow, M.D.  Electronically Signed    DL/MEDQ  D:  16/07/9603  T:  10/01/2006  Job:  [id number]
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
## 2011-02-17 NOTE — Group Therapy Note (Signed)
NAME:  Regina Coleman, Regina Coleman NO.:  [id number]   MEDICAL RECORD NO.:  [id number]          PATIENT TYPE:  OIB   LOCATION:  A415                          FACILITY:  [facility]   PHYSICIAN:  Tilda Burrow, M.D. DATE OF BIRTH:  06-04-1986   DATE OF PROCEDURE:  DATE OF DISCHARGE:  10/18/2006                                 PROGRESS NOTE   [name] is a 25 year old gravida 3, para 2 at 54-weeks gestation who  came in with complaints of feeling that the baby has dropped. She is not  having any complaints of contractions. She was placed on the monitor,  the fetal heart rate looks fine. She is not having any contractions. A  urinalysis is pending. She says if there are any drugs in her urine this  time that something is wrong. She does have a history of cocaine abuse  throughout this pregnancy.   We are going to continue to monitor and wait for urinalysis and then  send her home if everything remains fine.   Her cervix is 1-2 cm, very posterior, 50% effaced and -1 station.      [name], C.N.M.      Tilda Burrow, M.D.  Electronically Signed    FC/MEDQ  D:  10/18/2006  T:  10/18/2006  Job:  [id number]   cc:   [facility]
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
## 2011-02-21 ENCOUNTER — Inpatient Hospital Stay (HOSPITAL_COMMUNITY)
Admission: AD | Admit: 2011-02-21 | Discharge: 2011-02-21 | Disposition: A | Discharge status: Home / Self Care | Payer: Self-pay | Source: Ambulatory Visit | Attending: Obstetrics & Gynecology | Admitting: Obstetrics & Gynecology

## 2011-02-21 DIAGNOSIS — N390 Urinary tract infection, site not specified: Secondary | ICD-10-CM | POA: Insufficient documentation

## 2011-02-21 DIAGNOSIS — O2390 Unspecified genitourinary tract infection in pregnancy, unspecified trimester: Secondary | ICD-10-CM | POA: Insufficient documentation

## 2011-02-21 DIAGNOSIS — J309 Allergic rhinitis, unspecified: Secondary | ICD-10-CM | POA: Insufficient documentation

## 2011-02-21 DIAGNOSIS — O4700 False labor before 37 completed weeks of gestation, unspecified trimester: Secondary | ICD-10-CM | POA: Insufficient documentation

## 2011-02-21 LAB — URINALYSIS, ROUTINE W REFLEX MICROSCOPIC
Glucose, UA: NEGATIVE mg/dL
Specific Gravity, Urine: 1.02 (ref 1.005–1.030)
pH: 6.5 (ref 5.0–8.0)

## 2011-02-21 LAB — URINE MICROSCOPIC-ADD ON

## 2011-02-22 LAB — URINE CULTURE

## 2011-02-23 LAB — GC/CHLAMYDIA PROBE AMP, GENITAL: Chlamydia, DNA Probe: POSITIVE — AB

## 2011-03-12 ENCOUNTER — Inpatient Hospital Stay (HOSPITAL_COMMUNITY)
Admission: AD | Admit: 2011-03-12 | Discharge: 2011-03-12 | Disposition: A | Discharge status: Home / Self Care | Payer: Self-pay | Source: Ambulatory Visit | Attending: Obstetrics & Gynecology | Admitting: Obstetrics & Gynecology

## 2011-03-12 DIAGNOSIS — O4700 False labor before 37 completed weeks of gestation, unspecified trimester: Secondary | ICD-10-CM | POA: Insufficient documentation

## 2011-03-12 LAB — URINALYSIS, ROUTINE W REFLEX MICROSCOPIC
Glucose, UA: NEGATIVE mg/dL
Hgb urine dipstick: NEGATIVE
Leukocytes, UA: NEGATIVE
Protein, ur: NEGATIVE mg/dL
Specific Gravity, Urine: 1.015 (ref 1.005–1.030)

## 2011-03-12 LAB — WET PREP, GENITAL: Trich, Wet Prep: NONE SEEN

## 2011-03-12 LAB — FETAL FIBRONECTIN: Fetal Fibronectin: NEGATIVE

## 2011-03-14 LAB — GC/CHLAMYDIA PROBE AMP, URINE: GC Probe Amp, Urine: NEGATIVE

## 2011-03-20 LAB — GC/CHLAMYDIA PROBE AMP, GENITAL: Chlamydia: NEGATIVE

## 2011-04-06 ENCOUNTER — Emergency Department (HOSPITAL_COMMUNITY)
Admission: EM | Admit: 2011-04-06 | Discharge: 2011-04-06 | Disposition: A | Discharge status: Home / Self Care | Payer: Medicaid Other | Attending: Emergency Medicine | Admitting: Emergency Medicine

## 2011-04-06 DIAGNOSIS — O4700 False labor before 37 completed weeks of gestation, unspecified trimester: Secondary | ICD-10-CM | POA: Insufficient documentation

## 2011-04-11 ENCOUNTER — Inpatient Hospital Stay (HOSPITAL_COMMUNITY): Admission: AD | Admit: 2011-04-11 | Payer: Self-pay | Source: Ambulatory Visit | Admitting: Obstetrics & Gynecology

## 2011-04-11 ENCOUNTER — Encounter (HOSPITAL_COMMUNITY): Payer: Self-pay

## 2011-04-11 ENCOUNTER — Inpatient Hospital Stay (HOSPITAL_COMMUNITY)
Admission: AD | Admit: 2011-04-11 | Discharge: 2011-04-11 | Disposition: A | Discharge status: Home / Self Care | Payer: Medicaid Other | Source: Ambulatory Visit | Attending: Obstetrics & Gynecology | Admitting: Obstetrics & Gynecology

## 2011-04-11 DIAGNOSIS — O99891 Other specified diseases and conditions complicating pregnancy: Secondary | ICD-10-CM | POA: Insufficient documentation

## 2011-04-11 DIAGNOSIS — O9989 Other specified diseases and conditions complicating pregnancy, childbirth and the puerperium: Secondary | ICD-10-CM

## 2011-04-11 DIAGNOSIS — N949 Unspecified condition associated with female genital organs and menstrual cycle: Secondary | ICD-10-CM

## 2011-04-11 DIAGNOSIS — Z3480 Encounter for supervision of other normal pregnancy, unspecified trimester: Secondary | ICD-10-CM

## 2011-04-11 HISTORY — DX: Mental disorder, not otherwise specified: F99

## 2011-04-11 HISTORY — DX: Attention-deficit hyperactivity disorder, unspecified type: F90.9

## 2011-04-11 HISTORY — DX: Reserved for concepts with insufficient information to code with codable children: IMO0002

## 2011-04-11 HISTORY — DX: Bipolar II disorder: F31.81

## 2011-04-11 NOTE — Progress Notes (Signed)
Pt states she had sex last night and had a thick mucus d/c today. Was seen at [facility] and told she had a POS Fern, and was 3.5 cm. Pt was sent to MAU for evaluation.

## 2011-04-11 NOTE — ED Provider Notes (Signed)
History     Chief Complaint  Patient presents with  . Back Pain   HPI   25  Yr old black female, Gravida 34,    Para 67 at 35 weeks presents with complaint of liquid mucous soaking her clothing today. Did have intercourse last night. States was examined at [facility] and told she had ferning. Wants to deliver now.  Denies contractions or bleeding.   Past Medical History  Diagnosis Date  . No pertinent past medical history   . Genital herpes     Also Chlamydia, GC, HPV and Trich  . Mental disorder   . Bipolar 2 disorder   . ADHD (attention deficit hyperactivity disorder)   . Abnormal Pap smear     during preg, will retest after  . Abnormal Pap smear and cervical HPV (human papillomavirus)      Past Surgical History  Procedure Date  . No past surgeries     Family History  Problem Relation Age of Onset  . Diabetes Paternal Grandmother    History   Social History  . Marital Status: Single    Spouse Name: N/A    Number of Children: N/A  . Years of Education: N/A   Occupational History  . Not on file.   Social History Main Topics  . Smoking status: Never Smoker   . Smokeless tobacco: Never Used  . Alcohol Use: No  . Drug Use: Yes    Special: Cocaine     Last admitted to use in January 2012  . Sexually Active: Yes     07/09 >>>  wants to use Implanon after delivery   Other Topics Concern  . Not on file   Social History Narrative  . No narrative on file    Allergies: No Known Allergies  No prescriptions prior to admission    Review of Systems  Constitutional: Negative for fever.  Genitourinary:       Vaginal discharge    Physical Exam   Blood pressure 113/64, pulse 101, temperature 97.8 F (36.6 C), temperature source Oral, resp. rate 20, height 5\' 4"  (1.626 m), weight 105.053 kg (231 lb 9.6 oz).  Physical Exam  Constitutional: She appears well-developed and well-nourished.  HENT:  Head: Normocephalic.  Neck: Normal range of motion.    Cardiovascular: Normal rate.   Respiratory: Effort normal.  GI: Soft. She exhibits no distension.  Genitourinary: Uterus normal. Vaginal discharge found.       Speculum exam showed milky white mucous, no pooling, no ferning. Cervix tight 3/50/-3/posterior.  EFM reactive FHR with rare contraction (none in 30 min)  Neurological: She is alert.  Skin: Skin is warm and dry.    MAU Course  Procedures  A:  IUP @ 35 weeks Vaginal discharge, no evidence of PPROM Not in labor  P:  D/C home Labor precautions given

## 2011-04-12 ENCOUNTER — Inpatient Hospital Stay (HOSPITAL_COMMUNITY): Admission: EM | Admit: 2011-04-12 | Payer: Self-pay | Source: Ambulatory Visit | Admitting: Obstetrics and Gynecology

## 2011-04-12 ENCOUNTER — Inpatient Hospital Stay (HOSPITAL_COMMUNITY)
Admission: EM | Admit: 2011-04-12 | Discharge: 2011-04-12 | Disposition: A | Discharge status: Home / Self Care | Payer: Medicaid Other | Source: Ambulatory Visit | Attending: Obstetrics and Gynecology | Admitting: Obstetrics and Gynecology

## 2011-04-12 ENCOUNTER — Encounter (HOSPITAL_COMMUNITY): Payer: Self-pay

## 2011-04-12 DIAGNOSIS — O9989 Other specified diseases and conditions complicating pregnancy, childbirth and the puerperium: Secondary | ICD-10-CM

## 2011-04-12 DIAGNOSIS — O99891 Other specified diseases and conditions complicating pregnancy: Secondary | ICD-10-CM | POA: Insufficient documentation

## 2011-04-12 DIAGNOSIS — Z3480 Encounter for supervision of other normal pregnancy, unspecified trimester: Secondary | ICD-10-CM

## 2011-04-12 LAB — WET PREP, GENITAL: Yeast Wet Prep HPF POC: NONE SEEN

## 2011-04-12 LAB — POCT FERN TEST: Fern Test: NEGATIVE

## 2011-04-12 LAB — AMNISURE RUPTURE OF MEMBRANE (ROM) NOT AT ARMC: Amnisure ROM: NEGATIVE

## 2011-04-12 MED ORDER — ACETAMINOPHEN 500 MG PO TABS
1000.0000 mg | ORAL_TABLET | Freq: Once | ORAL | Status: AC
  Administered 2011-04-12: 1000 mg via ORAL
  Filled 2011-04-12: qty 2

## 2011-04-12 NOTE — Progress Notes (Signed)
Pt states she was seen in MAU 7-10 to R/O ROM. Was evaluated and sent home. Pt states she continues to have leaking, not wearing a pad but her panties are wet with what appears to be clear fluid. Pt given a pad to wear.

## 2011-04-12 NOTE — ED Provider Notes (Signed)
Chief Complaint:  Contractions   Regina Coleman is  25 y.o. [phone] at [redacted]w[redacted]d presents complaining of leakage of fluid.  Pt was seen here yesterday with pos fern s/p recent intercourse.  She was ruled out for rupture of membranes, but still c/o leakage of clear fluid. She states irregular, every 10 minutes associated with no vaginal bleeding, clear fluid, ?ROM, along with active Fetal movement.  She also notes some cramping with lower abdominal pressure.   Obstetrical/Gynecological History: OB History    Grav Para Term Preterm Abortions TAB SAB Ect Mult Living   9 6 6  0 2 0 2 0 0 6     H/o HSV not on any meds, denies any current symptoms, abnormal pap this pregnancy to be repeated pp, denies any STIs  Past Medical History: Past Medical History  Diagnosis Date  . Genital herpes     Also Chlamydia, GC, HPV and Trich  . Mental disorder   . Bipolar 2 disorder   . ADHD (attention deficit hyperactivity disorder)   . Abnormal Pap smear     during preg, will retest after  . Abnormal Pap smear and cervical HPV (human papillomavirus)     Past Surgical History: Past Surgical History  Procedure Date  . No past surgeries     Family History: Family History  Problem Relation Age of Onset  . Diabetes Paternal Grandmother   . Heart disease Paternal Grandmother   . Hypertension Paternal Grandmother     Social History: History  Substance Use Topics  . Smoking status: Never Smoker   . Smokeless tobacco: Never Used  . Alcohol Use: No    Allergies: No Known Allergies  Prescriptions prior to admission  Medication Sig Dispense Refill  . prenatal vitamin w/FE, FA (PRENATAL 1 + 1) 27-1 MG TABS Take 1 tablet by mouth daily.          Review of Systems - Negative except per HPI  Physical Exam   Blood pressure 118/69, pulse 97, temperature 98.1 F (36.7 C), temperature source Oral, resp. rate 20, height 5\' 3"  (1.6 m), weight 105.053 kg (231 lb 9.6 oz), SpO2 96.00%.  General:  General appearance - alert, well appearing, and in no distress Chest - clear to auscultation, no wheezes, rales or rhonchi, symmetric air entry Heart - normal rate, regular rhythm, normal S1, S2, no murmurs, rubs, clicks or gallops Abdomen - soft, nontender, nondistended, no masses or organomegaly Gravid, size cwd, vertex by leopolds, EFW 6-7lbs by leopolds Pelvic - normal external genitalia, vulva, vagina, cervix, uterus and adnexa Extremities - peripheral pulses normal, no pedal edema, no clubbing or cyanosis Skin - normal coloration and turgor, no rashes, no suspicious skin lesions noted Baseline: 140 bpm, Variability: Good {> 6 bpm), Accelerations: Reactive and Decelerations: Absent None Cx: 2-3/50/-3, fern neg    Labs: No results found for this or any previous visit (from the past 24 hour(s)). Imaging Studies:  No results found.   Assessment: WESLEE PRESTAGE is  25 y.o. [id number] at 107w1d presents with r/o rupture of membranes.  Plan: amnisure negative, fern neg, toco without contractions, FHTs-reactive.  D/c home with PTL warnings. RTC in one week for eval.    Nohlan Burdin,LACHELLE7/11/20122:02 PM

## 2011-04-13 LAB — GC/CHLAMYDIA PROBE AMP, GENITAL: GC Probe Amp, Genital: NEGATIVE

## 2011-04-21 ENCOUNTER — Ambulatory Visit (HOSPITAL_COMMUNITY): Admission: RE | Admit: 2011-04-21 | Payer: Self-pay | Source: Ambulatory Visit

## 2011-04-21 ENCOUNTER — Other Ambulatory Visit: Payer: Self-pay | Admitting: Family Medicine

## 2011-04-21 DIAGNOSIS — O3660X0 Maternal care for excessive fetal growth, unspecified trimester, not applicable or unspecified: Secondary | ICD-10-CM

## 2011-04-25 ENCOUNTER — Ambulatory Visit (HOSPITAL_COMMUNITY)
Admission: RE | Admit: 2011-04-25 | Discharge: 2011-04-25 | Disposition: A | Discharge status: Home / Self Care | Payer: Medicaid Other | Source: Ambulatory Visit | Attending: Family Medicine | Admitting: Family Medicine

## 2011-04-25 ENCOUNTER — Ambulatory Visit (HOSPITAL_COMMUNITY): Admission: RE | Admit: 2011-04-25 | Payer: Self-pay | Source: Ambulatory Visit

## 2011-04-25 DIAGNOSIS — F1920 Other psychoactive substance dependence, uncomplicated: Secondary | ICD-10-CM | POA: Insufficient documentation

## 2011-04-25 DIAGNOSIS — F1410 Cocaine abuse, uncomplicated: Secondary | ICD-10-CM | POA: Insufficient documentation

## 2011-04-25 DIAGNOSIS — O3660X0 Maternal care for excessive fetal growth, unspecified trimester, not applicable or unspecified: Secondary | ICD-10-CM | POA: Insufficient documentation

## 2011-04-28 LAB — STREP B DNA PROBE: GBS: POSITIVE

## 2011-04-30 ENCOUNTER — Encounter (HOSPITAL_COMMUNITY): Payer: Self-pay | Admitting: Anesthesiology

## 2011-04-30 ENCOUNTER — Inpatient Hospital Stay (HOSPITAL_COMMUNITY): Payer: Medicaid Other | Admitting: Anesthesiology

## 2011-04-30 ENCOUNTER — Inpatient Hospital Stay (HOSPITAL_COMMUNITY)
Admission: AD | Admit: 2011-04-30 | Discharge: 2011-05-03 | DRG: 775 | Disposition: A | Discharge status: Home / Self Care | Payer: Medicaid Other | Source: Ambulatory Visit | Attending: Obstetrics & Gynecology | Admitting: Obstetrics & Gynecology

## 2011-04-30 ENCOUNTER — Encounter (HOSPITAL_COMMUNITY): Payer: Self-pay | Admitting: Physician Assistant

## 2011-04-30 DIAGNOSIS — O4290 Premature rupture of membranes, unspecified as to length of time between rupture and onset of labor, unspecified weeks of gestation: Principal | ICD-10-CM | POA: Diagnosis present

## 2011-04-30 DIAGNOSIS — Z3480 Encounter for supervision of other normal pregnancy, unspecified trimester: Secondary | ICD-10-CM

## 2011-04-30 DIAGNOSIS — IMO0002 Reserved for concepts with insufficient information to code with codable children: Secondary | ICD-10-CM | POA: Diagnosis present

## 2011-04-30 LAB — CBC
Hemoglobin: 11.9 g/dL — ABNORMAL LOW (ref 12.0–15.0)
MCH: 30.5 pg (ref 26.0–34.0)
MCHC: 33.6 g/dL (ref 30.0–36.0)
MCV: 90.8 fL (ref 78.0–100.0)
RBC: 3.9 MIL/uL (ref 3.87–5.11)

## 2011-04-30 MED ORDER — BENZOCAINE 20 % MT GEL
Freq: Four times a day (QID) | OROMUCOSAL | Status: DC | PRN
  Filled 2011-04-30: qty 9

## 2011-04-30 MED ORDER — ACETAMINOPHEN 325 MG PO TABS
650.0000 mg | ORAL_TABLET | ORAL | Status: DC | PRN
  Administered 2011-04-30: 650 mg via ORAL
  Filled 2011-04-30: qty 2

## 2011-04-30 MED ORDER — EPHEDRINE 5 MG/ML INJ
10.0000 mg | INTRAVENOUS | Status: DC | PRN
  Administered 2011-04-30: 10 mg via INTRAVENOUS
  Filled 2011-04-30: qty 4

## 2011-04-30 MED ORDER — IBUPROFEN 600 MG PO TABS
600.0000 mg | ORAL_TABLET | Freq: Four times a day (QID) | ORAL | Status: DC | PRN
  Administered 2011-05-01: 600 mg via ORAL
  Filled 2011-04-30: qty 1

## 2011-04-30 MED ORDER — DIPHENHYDRAMINE HCL 50 MG/ML IJ SOLN: 12.5000 mg | INTRAMUSCULAR | Status: DC | PRN

## 2011-04-30 MED ORDER — FENTANYL CITRATE 0.05 MG/ML IJ SOLN: 100.0000 ug | INTRAMUSCULAR | Status: DC | PRN

## 2011-04-30 MED ORDER — LIDOCAINE HCL (PF) 1 % IJ SOLN
30.0000 mL | INTRAMUSCULAR | Status: DC | PRN
  Filled 2011-04-30 (×2): qty 30

## 2011-04-30 MED ORDER — PENICILLIN G POTASSIUM 5000000 UNITS IJ SOLR
5.0000 10*6.[IU] | Freq: Once | INTRAVENOUS | Status: AC
  Administered 2011-04-30: 5 10*6.[IU] via INTRAVENOUS
  Filled 2011-04-30: qty 5

## 2011-04-30 MED ORDER — LACTATED RINGERS IV SOLN
INTRAVENOUS | Status: DC
  Administered 2011-04-30: 125 mL/h via INTRAVENOUS
  Administered 2011-04-30: 23:00:00 via INTRAVENOUS

## 2011-04-30 MED ORDER — FLEET ENEMA 7-19 GM/118ML RE ENEM: 1.0000 | ENEMA | RECTAL | Status: DC | PRN

## 2011-04-30 MED ORDER — LACTATED RINGERS IV SOLN
500.0000 mL | Freq: Once | INTRAVENOUS | Status: AC
  Administered 2011-04-30: 1000 mL via INTRAVENOUS

## 2011-04-30 MED ORDER — PRENATAL PLUS 27-1 MG PO TABS: 1.0000 | ORAL_TABLET | Freq: Every day | ORAL | Status: DC

## 2011-04-30 MED ORDER — TERBUTALINE SULFATE 1 MG/ML IJ SOLN: 0.2500 mg | Freq: Once | INTRAMUSCULAR | Status: AC | PRN

## 2011-04-30 MED ORDER — CITRIC ACID-SODIUM CITRATE 334-500 MG/5ML PO SOLN: 30.0000 mL | ORAL | Status: DC | PRN

## 2011-04-30 MED ORDER — PHENYLEPHRINE 40 MCG/ML (10ML) SYRINGE FOR IV PUSH (FOR BLOOD PRESSURE SUPPORT)
80.0000 ug | PREFILLED_SYRINGE | INTRAVENOUS | Status: DC | PRN
  Administered 2011-04-30: 80 ug via INTRAVENOUS
  Filled 2011-04-30 (×2): qty 5

## 2011-04-30 MED ORDER — OXYTOCIN 20 UNITS IN LACTATED RINGERS INFUSION - SIMPLE
1.0000 m[IU]/min | INTRAVENOUS | Status: DC
  Administered 2011-04-30: 4 m[IU]/min via INTRAVENOUS
  Administered 2011-04-30: 16 m[IU]/min via INTRAVENOUS
  Administered 2011-04-30: 2 m[IU]/min via INTRAVENOUS

## 2011-04-30 MED ORDER — OXYCODONE-ACETAMINOPHEN 5-325 MG PO TABS: 2.0000 | ORAL_TABLET | ORAL | Status: DC | PRN

## 2011-04-30 MED ORDER — ONDANSETRON HCL 4 MG/2ML IJ SOLN
4.0000 mg | Freq: Four times a day (QID) | INTRAMUSCULAR | Status: DC | PRN
  Administered 2011-04-30 – 2011-05-01 (×2): 4 mg via INTRAVENOUS
  Filled 2011-04-30 (×3): qty 2

## 2011-04-30 MED ORDER — OXYTOCIN 20 UNITS IN LACTATED RINGERS INFUSION - SIMPLE
125.0000 mL/h | Freq: Once | INTRAVENOUS | Status: AC
  Administered 2011-05-01: 999 mL/h via INTRAVENOUS
  Filled 2011-04-30: qty 1000

## 2011-04-30 MED ORDER — FENTANYL 2.5 MCG/ML BUPIVACAINE 1/10 % EPIDURAL INFUSION (WH - ANES)
14.0000 mL/h | INTRAMUSCULAR | Status: DC
  Administered 2011-04-30 – 2011-05-01 (×3): 14 mL/h via EPIDURAL
  Filled 2011-04-30 (×3): qty 60

## 2011-04-30 MED ORDER — LACTATED RINGERS IV SOLN
500.0000 mL | INTRAVENOUS | Status: DC | PRN
  Administered 2011-04-30: 500 mL via INTRAVENOUS

## 2011-04-30 MED ORDER — PHENYLEPHRINE 40 MCG/ML (10ML) SYRINGE FOR IV PUSH (FOR BLOOD PRESSURE SUPPORT)
80.0000 ug | PREFILLED_SYRINGE | INTRAVENOUS | Status: DC | PRN
  Filled 2011-04-30: qty 5

## 2011-04-30 MED ORDER — DEXTROSE 5 % IV SOLN
2.5000 10*6.[IU] | INTRAVENOUS | Status: DC
  Administered 2011-04-30 (×2): 2.5 10*6.[IU] via INTRAVENOUS
  Filled 2011-04-30 (×6): qty 2.5

## 2011-04-30 MED ORDER — EPHEDRINE 5 MG/ML INJ
10.0000 mg | INTRAVENOUS | Status: DC | PRN
  Filled 2011-04-30 (×2): qty 4

## 2011-04-30 NOTE — Anesthesia Preprocedure Evaluation (Signed)
Anesthesia Evaluation  Name, MR# and DOB Patient awake  General Assessment Comment  Reviewed: Allergy & Precautions, H&P  and Patient's Chart, lab work & pertinent test results  Airway Mallampati: IV TM Distance: >3 FB Neck ROM: full    Dental No notable dental hx    Pulmonaryneg pulmonary ROS    clear to auscultation  pulmonary exam normal   Cardiovascular regular Normal   Neuro/Psych (+) {AN ROS/MED HX NEURO HEADACHES (+) PSYCHIATRIC DISORDERS, Negative Neurological ROS Negative Psych ROS  GI/Hepatic/Renal negative GI ROS, negative Liver ROS, and negative Renal ROS (+)       Endo/Other  Negative Endocrine ROS (+)  Morbid obesity Abdominal   Musculoskeletal  Hematology negative hematology ROS (+)   Peds  Reproductive/Obstetrics (+) Pregnancy   Anesthesia Other Findings             Anesthesia Physical Anesthesia Plan  ASA: III  Anesthesia Plan: Epidural   Post-op Pain Management:    Induction:   Airway Management Planned:   Additional Equipment:   Intra-op Plan:   Post-operative Plan:   Informed Consent: I have reviewed the patients History and Physical, chart, labs and discussed the procedure including the risks, benefits and alternatives for the proposed anesthesia with the patient or authorized representative who has indicated his/her understanding and acceptance.     Plan Discussed with:   Anesthesia Plan Comments:         Anesthesia Quick Evaluation

## 2011-04-30 NOTE — Progress Notes (Signed)
Regina Coleman is a 25 y.o. [phone] at [redacted]w[redacted]d admitted for rupture of membranes  Subjective: Comfortable with epidural in place  Objective: BP 101/53  Pulse 79  Temp(Src) 97.8 F (36.6 C) (Oral)  Resp 18  Ht 5' 3.5" (1.613 m)  Wt 105.688 kg (233 lb)  BMI 40.63 kg/m2  SpO2 99%      FHT:  FHR: 140 bpm, variability: moderate,  accelerations:  Present,  decelerations:  Absent UC:   irregular,  SVE:   Dilation: 3 Effacement (%): 80 Station: -2 Exam by:: a. harper, rnc-ob  Labs: Lab Results  Component Value Date   WBC 14.4* 04/30/2011   HGB 11.9* 04/30/2011   HCT 35.4* 04/30/2011   MCV 90.8 04/30/2011   PLT 307 04/30/2011    Assessment / Plan: Augmentation of labor, progressing well  Labor: Progressing normally Fetal Wellbeing:  Category I Pain Control:  Epidural Anticipated MOD:  NSVD  Regina Coleman E. 04/30/2011, 10:17 PM

## 2011-04-30 NOTE — Progress Notes (Signed)
Pt reports noticed leaking about 1115 this am. Denies ctx. Reports good fetal movement

## 2011-04-30 NOTE — Anesthesia Procedure Notes (Signed)
Epidural Patient location during procedure: OB Start time: 04/30/2011 4:28 PM  Staffing Anesthesiologist: Brayton Caves R Performed by: anesthesiologist   Preanesthetic Checklist Completed: patient identified, site marked, surgical consent, pre-op evaluation, timeout performed, IV checked, risks and benefits discussed and monitors and equipment checked  Epidural Patient position: sitting Prep: site prepped and draped and DuraPrep Patient monitoring: continuous pulse ox and blood pressure Approach: midline Injection technique: LOR saline  Needle:  Needle type: Tuohy  Needle gauge: 17 G Needle length: 9 cm Needle insertion depth: 8 cm Catheter type: closed end flexible Catheter size: 19 Gauge Catheter at skin depth: 10 and 13 cm Test dose: negative  Assessment Events: blood not aspirated, injection not painful, no injection resistance, negative IV test and no paresthesia  Additional Notes Patient identified.  Risk benefits discussed including failed block, incomplete pain control, headache, nerve damage, paralysis, blood pressure changes, nausea, vomiting, reactions to medication both toxic or allergic, and postpartum back pain.  Patient expressed understanding and wished to proceed.  All questions were answered.  Sterile technique used throughout procedure and epidural site dressed with sterile barrier dressing. No paresthesia or other complications noted. 5 minutes prior to starting epidural infusion of fentanyl and bupivicaine, the epidural was loaded with 9 ml of 1.5% lidocaine in three separate doses.  The patient did not experience any signs of intravascular injection such as tinnitus or metallic taste in mouth nor signs of intrathecal spread such as rapid motor block. Please see nursing notes for vital signs.

## 2011-04-30 NOTE — H&P (Signed)
Regina Coleman is a 25 y.o. female [phone] with IUP at [redacted]w[redacted]d presenting for SROM. Pt states she has been having irregular contractions, none, ruptured, clear fluid, with active.  She desires to possible BTS.  [facility] at [facility] since 21.6 wks  Prenatal History/Complications:  Past Medical History: Past Medical History  Diagnosis Date  . Genital herpes     Also Chlamydia, GC, HPV and Trich  . Mental disorder   . Bipolar 2 disorder   . ADHD (attention deficit hyperactivity disorder)   . Abnormal Pap smear     during preg, will retest after  . Abnormal Pap smear and cervical HPV (human papillomavirus)     Past Surgical History: Past Surgical History  Procedure Date  . No past surgeries     Obstetrical History: OB History    Grav Para Term Preterm Abortions TAB SAB Ect Mult Living   9 6 6  0 2 0 2 0 0 6      Social History: History   Social History  . Marital Status: Single    Spouse Name: N/A    Number of Children: N/A  . Years of Education: N/A   Social History Main Topics  . Smoking status: Never Smoker   . Smokeless tobacco: Never Used  . Alcohol Use: No  . Drug Use: Yes    Special: Cocaine     pt states last used january 2012  . Sexually Active: Yes     07/09 >>>  wants to use Implanon after delivery   Other Topics Concern  . None   Social History Narrative  . None    Family History: Family History  Problem Relation Age of Onset  . Diabetes Paternal Grandmother   . Heart disease Paternal Grandmother   . Hypertension Paternal Grandmother     Allergies: No Known Allergies  Prescriptions prior to admission  Medication Sig Dispense Refill  . prenatal vitamin w/FE, FA (PRENATAL 1 + 1) 27-1 MG TABS Take 1 tablet by mouth daily.          Review of Systems - Negative except LOF and irregular contractions History obtained from chart review and the patient   Blood pressure 121/61, pulse 83, temperature 98.7 F (37.1 C), temperature source  Oral, resp. rate 18, height 5' 3.5" (1.613 m), weight 106.414 kg (234 lb 9.6 oz). General appearance: alert, cooperative and no distress Head: Normocephalic, without obvious abnormality, atraumatic Lungs: clear to auscultation bilaterally Heart: regular rate and rhythm, S1, S2 normal, no murmur, click, rub or gallop Abdomen: Gravid, nontender, irregular contractions Pelvic: external genitalia normal and Cervix: 1/70/vtx/-2 Clear fluid Skin: Skin color, texture, turgor normal. No rashes or lesions Neurologic: Alert and oriented X 3, normal strength and tone. Normal symmetric reflexes. Normal coordination and gait Reflexes: normal cephalic Baseline: 140 bpm, Variability: Good {> 6 bpm) and Accelerations: Reactive Irregular contractions, mild to palp Dilation: 1 Effacement (%): 70 Station: -2 Exam by:: S Sandon Yoho CNM   Prenatal labs: ABO, Rh:  A pos Antibody: Negative (04/09 0000) Rubella:   Imm RPR: Nonreactive (04/09 0000)  HBsAg:   Neg HIV: Non-reactive (04/09 0000)  GBS:   Pos 1 hr Glucola 107 Genetic screening  Anatomy US NL at 21 weeks   Assessment: Regina Coleman is a 25 y.o. female presenting for PROM at term  Plan: Admit to BS GBS prophylaxis with PCN Expectant management   Regina Kugel E. 04/30/2011, 2:27 PM

## 2011-05-01 ENCOUNTER — Encounter (HOSPITAL_COMMUNITY): Payer: Self-pay | Admitting: *Deleted

## 2011-05-01 DIAGNOSIS — O4290 Premature rupture of membranes, unspecified as to length of time between rupture and onset of labor, unspecified weeks of gestation: Secondary | ICD-10-CM

## 2011-05-01 LAB — MRSA PCR SCREENING: MRSA by PCR: NEGATIVE

## 2011-05-01 MED ORDER — BENZOCAINE-MENTHOL 20-0.5 % EX AERO
INHALATION_SPRAY | CUTANEOUS | Status: AC
  Filled 2011-05-01: qty 56

## 2011-05-01 MED ORDER — DIPHENHYDRAMINE HCL 25 MG PO CAPS: 25.0000 mg | ORAL_CAPSULE | Freq: Four times a day (QID) | ORAL | Status: DC | PRN

## 2011-05-01 MED ORDER — LACTATED RINGERS IV SOLN: INTRAVENOUS | Status: DC

## 2011-05-01 MED ORDER — TETANUS-DIPHTH-ACELL PERTUSSIS 5-2.5-18.5 LF-MCG/0.5 IM SUSP
0.5000 mL | Freq: Once | INTRAMUSCULAR | Status: AC
  Administered 2011-05-02: 0.5 mL via INTRAMUSCULAR

## 2011-05-01 MED ORDER — METOCLOPRAMIDE HCL 10 MG PO TABS: 10.0000 mg | ORAL_TABLET | Freq: Once | ORAL | Status: DC

## 2011-05-01 MED ORDER — LANOLIN HYDROUS EX OINT: TOPICAL_OINTMENT | CUTANEOUS | Status: DC | PRN

## 2011-05-01 MED ORDER — FAMOTIDINE 20 MG PO TABS: 40.0000 mg | ORAL_TABLET | Freq: Once | ORAL | Status: DC

## 2011-05-01 MED ORDER — OXYCODONE-ACETAMINOPHEN 5-325 MG PO TABS
1.0000 | ORAL_TABLET | ORAL | Status: DC | PRN
  Administered 2011-05-01: 2 via ORAL
  Administered 2011-05-01 (×2): 1 via ORAL
  Administered 2011-05-01: 2 via ORAL
  Administered 2011-05-02 – 2011-05-03 (×3): 1 via ORAL
  Filled 2011-05-01: qty 2
  Filled 2011-05-01: qty 1
  Filled 2011-05-01 (×3): qty 2
  Filled 2011-05-01: qty 1
  Filled 2011-05-01 (×3): qty 2
  Filled 2011-05-01: qty 1

## 2011-05-01 MED ORDER — FAMOTIDINE 20 MG PO TABS: 40.0000 mg | ORAL_TABLET | Freq: Once | ORAL | Status: AC | PRN

## 2011-05-01 MED ORDER — IBUPROFEN 600 MG PO TABS
600.0000 mg | ORAL_TABLET | Freq: Four times a day (QID) | ORAL | Status: DC
  Administered 2011-05-01 – 2011-05-03 (×8): 600 mg via ORAL
  Filled 2011-05-01 (×8): qty 1

## 2011-05-01 MED ORDER — BENZOCAINE-MENTHOL 20-0.5 % EX AERO: 1.0000 "application " | INHALATION_SPRAY | CUTANEOUS | Status: DC | PRN

## 2011-05-01 MED ORDER — PRENATAL PLUS 27-1 MG PO TABS
1.0000 | ORAL_TABLET | Freq: Every day | ORAL | Status: DC
  Administered 2011-05-02: 1 via ORAL
  Filled 2011-05-01 (×2): qty 1

## 2011-05-01 MED ORDER — SIMETHICONE 80 MG PO CHEW
80.0000 mg | CHEWABLE_TABLET | ORAL | Status: DC | PRN
Start: 2011-05-01 — End: 2011-05-03

## 2011-05-01 MED ORDER — WITCH HAZEL-GLYCERIN EX PADS: MEDICATED_PAD | CUTANEOUS | Status: DC | PRN

## 2011-05-01 MED ORDER — ZOLPIDEM TARTRATE 5 MG PO TABS: 5.0000 mg | ORAL_TABLET | Freq: Every evening | ORAL | Status: DC | PRN

## 2011-05-01 MED ORDER — ONDANSETRON HCL 4 MG/2ML IJ SOLN: 4.0000 mg | INTRAMUSCULAR | Status: DC | PRN

## 2011-05-01 MED ORDER — MISOPROSTOL 200 MCG PO TABS
ORAL_TABLET | ORAL | Status: AC
  Filled 2011-05-01: qty 5

## 2011-05-01 MED ORDER — SENNOSIDES-DOCUSATE SODIUM 8.6-50 MG PO TABS
1.0000 | ORAL_TABLET | Freq: Every day | ORAL | Status: DC
  Administered 2011-05-01 – 2011-05-02 (×2): 1 via ORAL

## 2011-05-01 MED ORDER — ONDANSETRON HCL 4 MG PO TABS
4.0000 mg | ORAL_TABLET | ORAL | Status: DC | PRN
Start: 2011-05-01 — End: 2011-05-03
  Administered 2011-05-01 (×2): 4 mg via ORAL
  Filled 2011-05-01 (×2): qty 1

## 2011-05-01 NOTE — Progress Notes (Signed)
Regina Coleman is a 25 y.o. [phone] at [redacted]w[redacted]d  Subjective: Comfortable with epidural in place  Objective: BP 121/61  Pulse 81  Temp(Src) 97.8 F (36.6 C) (Oral)  Resp 18  Ht 5' 3.5" (1.613 m)  Wt 105.688 kg (233 lb)  BMI 40.63 kg/m2  SpO2 99%      FHT:  FHR: 130 bpm, variability: moderate,  accelerations:  Present,  decelerations:  Absent UC:   regular, every 3-4 minutes SVE:   Dilation: 4.5 Effacement (%): 80 Station: -2 Exam by:: a. harper, rnc-ob  Labs: Lab Results  Component Value Date   WBC 14.4* 04/30/2011   HGB 11.9* 04/30/2011   HCT 35.4* 04/30/2011   MCV 90.8 04/30/2011   PLT 307 04/30/2011    Assessment / Plan: Augmentation of labor, progressing well  Labor: Progressing normally Preeclampsia:  no signs or symptoms of toxicity Fetal Wellbeing:  Category I Pain Control:  Epidural I/D:  n/a Anticipated MOD:  NSVD  Lunden Mcleish E. 05/01/2011, 12:18 AM

## 2011-05-01 NOTE — Anesthesia Postprocedure Evaluation (Signed)
  Anesthesia Post-op Note  Patient: Regina Coleman  Patient's cardiopulmonary status is stable Patient's level of consciousness: sedate but responsive verbally Pain and nausea are all reasonably controlled No anesthetic complications apparent at this time No follow up care necessary at this time

## 2011-05-01 NOTE — Progress Notes (Signed)
UR chart review completed.  

## 2011-05-02 NOTE — Progress Notes (Signed)
CPS worker, Morley Kos came to met with pt and complete safety assessment yesterday evening.  Pt signed the safety assessment, agreeing that she would have to be supervised when with the infant at all times.  She can visit infant in CN.  A Team Decision Meeting, TDM is scheduled for today at 3pm.  SW will attend and inform staff of outcome.

## 2011-05-02 NOTE — Progress Notes (Signed)
  Post Partum Day 1 Subjective: Regina Coleman is a 25yo [phone] with no complaints.  Pt states she is bleeding slightly heavier than her menstrual cycle but it has gone down.  She reports no pain.  Is up ad lib, voiding, + flatus, +BM.  Pt is bottle feeding.  Would like Implanon for BC.  Objective: Blood pressure 97/62, pulse 74, temperature 98.2 F (36.8 C), temperature source Oral, resp. rate 18, height 5' 3.5" (1.613 m), weight 233 lb (105.688 kg), SpO2 99.00%,  Physical Exam:  General: alert, cooperative and no distress Lochia: appropriate Heart:  RRR, distinct s1/s2, no M/G/R Lungs:  CTAB Abdomen:  + BS x 4, soft, slight pain/tenderness in RLQ Uterine Fundus: firm below umbilicus PV Evaluation: No cords or calf tenderness.  Trace edema around calf/ankles.  DP pulse +2 bilaterally   Basename 04/30/11 1420  HGB 11.9*  HCT 35.4*    Assessment/Plan: 1. Contraception - implanon 2. Continue post partum care   LOS: 2 days   Regina Doughty PA-S2 05/02/2011, 7:51 AM   Agree with above plan of care. Regina Coleman PGY1

## 2011-05-03 MED ORDER — DOCUSATE SODIUM 100 MG PO CAPS: 100.0000 mg | ORAL_CAPSULE | Freq: Two times a day (BID) | ORAL | Status: AC | PRN

## 2011-05-03 MED ORDER — FERROUS SULFATE 325 (65 FE) MG PO TABS: 325.0000 mg | ORAL_TABLET | Freq: Two times a day (BID) | ORAL | Status: DC

## 2011-05-03 MED ORDER — IBUPROFEN 600 MG PO TABS: 600.0000 mg | ORAL_TABLET | Freq: Four times a day (QID) | ORAL | Status: AC

## 2011-05-03 NOTE — Progress Notes (Signed)
CPS staff decided to place the infant with the paternal aunt, Regina Coleman.  SW advised [name] to schedule a follow up appointment prior to infant d/c.  The parents/family are allowed to visit with the baby in the nursery.  Infant approved to d/c to Regina Coleman when medically stable.

## 2011-05-03 NOTE — Discharge Summary (Signed)
Pt stable for discharge.  Agree with above note.  She plans on using implanon for contraception instead of BTL.

## 2011-05-03 NOTE — Progress Notes (Signed)
  Post Partum Day2 Subjective: no complaints, up ad lib, voiding and tolerating PO  Objective: Blood pressure 108/64, pulse 77, temperature 98 F (36.7 C), temperature source Oral, resp. rate 18, height 5' 3.5" (1.613 m), weight 233 lb (105.688 kg), SpO2 99.00%, unknown if currently breastfeeding.  Physical Exam:  General: alert, cooperative and no distress Lochia: appropriate Uterine Fundus: firm below the umbilicus DVT Evaluation: No evidence of DVT seen on physical exam. Negative Homan's sign.   Basename 04/30/11 1420  HGB 11.9*  HCT 35.4*    Assessment/Plan: Discharge home and Contraception  will get implanon at [place] visit, pt is bottle feeding   LOS: 3 days   Tifani Dack 05/03/2011, 6:38 AM

## 2011-05-03 NOTE — Discharge Summary (Signed)
  Obstetric Discharge Summary Reason for Admission: Labor Prenatal Procedures: ultrasound Intrapartum Procedures: spontaneous vaginal delivery Postpartum Procedures: none Complications-Operative and Postpartum: none Hemoglobin  Date Value Range Status  04/30/2011 11.9* 12.0-15.0 (g/dL) Final     HCT  Date Value Range Status  04/30/2011 35.4* 36.0-46.0 (%) Final    Discharge Diagnoses: Term Pregnancy-delivered  Discharge Information: Date: 05/03/2011 Activity: pelvic rest Diet: routine Medications: Ibuprophen, Colace and Iron, will get implanon at PP visit Condition: stable Instructions: refer to practice specific booklet Discharge to: home Follow-up Information    Follow up with [facility] [place] in 6 weeks. (post partum visit)    Contact information:   [address] [place] [address]          Newborn Data: Live born female  Birth Weight: 8 lb 15.9 oz (4080 g) APGAR: 8, 9  Home with mother.  Regina Coleman 05/03/2011, 6:41 AM

## 2011-06-23 LAB — POCT PREGNANCY, URINE: Operator id: 235561

## 2011-06-26 LAB — URINALYSIS, ROUTINE W REFLEX MICROSCOPIC
Bilirubin Urine: NEGATIVE
Glucose, UA: NEGATIVE
Ketones, ur: NEGATIVE
pH: 6

## 2011-06-26 LAB — CBC
HCT: 37.5
Hemoglobin: 13.1
RBC: 4.22
WBC: 10

## 2011-06-26 LAB — WET PREP, GENITAL

## 2011-06-26 LAB — ABO/RH: ABO/RH(D): A POS

## 2011-06-26 LAB — GC/CHLAMYDIA PROBE AMP, GENITAL: Chlamydia, DNA Probe: NEGATIVE

## 2011-06-30 LAB — WET PREP, GENITAL: Yeast Wet Prep HPF POC: NONE SEEN

## 2011-07-03 LAB — WET PREP, GENITAL: Clue Cells Wet Prep HPF POC: NONE SEEN

## 2011-07-03 LAB — CBC
Hemoglobin: 11.3 — ABNORMAL LOW
MCHC: 33.5
RBC: 3.83 — ABNORMAL LOW

## 2011-07-03 LAB — URINALYSIS, ROUTINE W REFLEX MICROSCOPIC
Bilirubin Urine: NEGATIVE
Glucose, UA: NEGATIVE
Hgb urine dipstick: NEGATIVE
Hgb urine dipstick: NEGATIVE
Ketones, ur: >80 — AB
Ketones, ur: 15 — AB
Nitrite: NEGATIVE
Protein, ur: NEGATIVE
Protein, ur: NEGATIVE
Specific Gravity, Urine: 1.015
Urobilinogen, UA: 2 — ABNORMAL HIGH
Urobilinogen, UA: >8 — ABNORMAL HIGH
Urobilinogen, UA: >8 — ABNORMAL HIGH

## 2011-07-03 LAB — RAPID URINE DRUG SCREEN, HOSP PERFORMED
Amphetamines: NOT DETECTED
Amphetamines: NOT DETECTED
Amphetamines: NOT DETECTED
Barbiturates: NOT DETECTED
Barbiturates: NOT DETECTED
Barbiturates: NOT DETECTED
Benzodiazepines: NOT DETECTED
Benzodiazepines: NOT DETECTED
Cocaine: NOT DETECTED
Opiates: NOT DETECTED
Opiates: NOT DETECTED

## 2011-07-03 LAB — URINE MICROSCOPIC-ADD ON

## 2011-07-03 LAB — POCT URINALYSIS DIP (DEVICE)
Glucose, UA: NEGATIVE
Nitrite: POSITIVE — AB
Operator id: 200901
Urobilinogen, UA: 4 — ABNORMAL HIGH

## 2011-07-03 LAB — GLUCOSE TOLERANCE, 1 HOUR: Glucose, 1 Hour GTT: 127

## 2011-07-03 LAB — STREP B DNA PROBE: Strep Group B Ag: POSITIVE

## 2011-07-03 LAB — URINE CULTURE: Colony Count: 3000

## 2011-07-03 LAB — RPR: RPR Ser Ql: NONREACTIVE

## 2011-07-04 LAB — POCT I-STAT, CHEM 8
BUN: 11
Calcium, Ion: 1.21
Chloride: 107
Creatinine, Ser: 1
Glucose, Bld: 81
HCT: 42
Hemoglobin: 14.3
Potassium: 4.2
Sodium: 142
TCO2: 27

## 2011-07-04 LAB — CBC
HCT: 40.5
Hemoglobin: 13.5
MCHC: 33.4
MCV: 85.5
Platelets: 314
RBC: 4.73
RDW: 13.2
WBC: 8.1

## 2011-07-04 LAB — WOUND CULTURE

## 2011-07-04 LAB — GLUCOSE, CAPILLARY: Glucose-Capillary: 94

## 2011-07-05 LAB — COMPREHENSIVE METABOLIC PANEL
ALT: 14
AST: 16
Albumin: 2.8 — ABNORMAL LOW
Alkaline Phosphatase: 71
BUN: 2 — ABNORMAL LOW
Chloride: 102
GFR calc Af Amer: >60
Potassium: 3.3 — ABNORMAL LOW
Sodium: 133 — ABNORMAL LOW
Total Protein: 6.2

## 2011-07-05 LAB — CBC
Platelets: 315
RDW: 12.4
WBC: 11.4 — ABNORMAL HIGH

## 2011-07-05 LAB — URINALYSIS, ROUTINE W REFLEX MICROSCOPIC
Hgb urine dipstick: NEGATIVE
Ketones, ur: 40 — AB
Specific Gravity, Urine: >1.03 — ABNORMAL HIGH
Urobilinogen, UA: 2 — ABNORMAL HIGH

## 2011-07-05 LAB — URINE MICROSCOPIC-ADD ON

## 2011-07-05 LAB — BASIC METABOLIC PANEL
CO2: 25
Glucose, Bld: 103 — ABNORMAL HIGH
Potassium: 3.7
Sodium: 136

## 2011-07-05 LAB — DIFFERENTIAL
Basophils Relative: 0
Eosinophils Relative: 2
Monocytes Absolute: 1.2 — ABNORMAL HIGH
Monocytes Relative: 10
Neutro Abs: 8.2 — ABNORMAL HIGH

## 2011-07-05 LAB — RAPID URINE DRUG SCREEN, HOSP PERFORMED
Barbiturates: NOT DETECTED
Benzodiazepines: NOT DETECTED

## 2011-07-07 LAB — DIFFERENTIAL
Basophils Absolute: 0
Basophils Relative: 0 % (ref 0–1)
Basophils Relative: 0
Eosinophils Absolute: 0.1 — ABNORMAL LOW
Eosinophils Relative: 1
Lymphocytes Relative: 33 % (ref 12–46)
Lymphs Abs: 2.6 10*3/uL (ref 0.7–4.0)
Monocytes Absolute: 1.1 10*3/uL — ABNORMAL HIGH (ref 0.1–1.0)
Monocytes Absolute: 1.2 — ABNORMAL HIGH
Monocytes Relative: 14 % — ABNORMAL HIGH (ref 3–12)
Monocytes Relative: 12
Neutro Abs: 4 10*3/uL (ref 1.7–7.7)
Neutro Abs: 6.6
Neutrophils Relative %: 51 % (ref 43–77)

## 2011-07-07 LAB — COMPREHENSIVE METABOLIC PANEL
Albumin: 4.2 g/dL (ref 3.5–5.2)
Alkaline Phosphatase: 81 U/L (ref 39–117)
BUN: 6 mg/dL (ref 6–23)
Calcium: 9.5 mg/dL (ref 8.4–10.5)
Glucose, Bld: 95 mg/dL (ref 70–99)
Potassium: 3.3 mEq/L — ABNORMAL LOW (ref 3.5–5.1)
Total Protein: 7.5 g/dL (ref 6.0–8.3)

## 2011-07-07 LAB — CBC
HCT: 40.3 % (ref 36.0–46.0)
HCT: 34.5 — ABNORMAL LOW
Hemoglobin: 13.4 g/dL (ref 12.0–15.0)
Hemoglobin: 12.1
MCHC: 34.7
MCHC: 35
MCV: 90.1
MCV: 90.9
Platelets: 378 10*3/uL (ref 150–400)
RBC: 3.51 — ABNORMAL LOW
RBC: 3.83 — ABNORMAL LOW
RDW: 12.7
RDW: 12.8
WBC: 7.9 10*3/uL (ref 4.0–10.5)

## 2011-07-07 LAB — URINALYSIS, ROUTINE W REFLEX MICROSCOPIC
Nitrite: NEGATIVE
Protein, ur: 100 mg/dL — AB
Urobilinogen, UA: 1 mg/dL (ref 0.0–1.0)

## 2011-07-07 LAB — ETHANOL: Alcohol, Ethyl (B): <5 mg/dL (ref 0–10)

## 2011-07-07 LAB — RAPID URINE DRUG SCREEN, HOSP PERFORMED
Benzodiazepines: NOT DETECTED
Cocaine: POSITIVE — AB
Tetrahydrocannabinol: NOT DETECTED

## 2011-07-12 LAB — URINALYSIS, ROUTINE W REFLEX MICROSCOPIC
Bilirubin Urine: NEGATIVE
Glucose, UA: NEGATIVE
Hgb urine dipstick: NEGATIVE
Ketones, ur: NEGATIVE
Nitrite: NEGATIVE
Protein, ur: NEGATIVE
Specific Gravity, Urine: 1.025
Urobilinogen, UA: 1
pH: 6.5

## 2011-07-17 LAB — WET PREP, GENITAL
Clue Cells Wet Prep HPF POC: NONE SEEN
Yeast Wet Prep HPF POC: NONE SEEN

## 2011-07-17 LAB — GC/CHLAMYDIA PROBE AMP, GENITAL: Chlamydia, DNA Probe: NEGATIVE

## 2011-07-18 LAB — WOUND CULTURE: Gram Stain: NONE SEEN

## 2011-08-31 ENCOUNTER — Encounter (HOSPITAL_COMMUNITY): Payer: Self-pay | Admitting: *Deleted

## 2011-08-31 ENCOUNTER — Emergency Department (HOSPITAL_COMMUNITY)
Admission: EM | Admit: 2011-08-31 | Discharge: 2011-08-31 | Disposition: A | Discharge status: Home / Self Care | Payer: Self-pay | Attending: Emergency Medicine | Admitting: Emergency Medicine

## 2011-08-31 DIAGNOSIS — F1410 Cocaine abuse, uncomplicated: Secondary | ICD-10-CM | POA: Insufficient documentation

## 2011-08-31 DIAGNOSIS — F319 Bipolar disorder, unspecified: Secondary | ICD-10-CM | POA: Insufficient documentation

## 2011-08-31 DIAGNOSIS — R0602 Shortness of breath: Secondary | ICD-10-CM | POA: Insufficient documentation

## 2011-08-31 LAB — BASIC METABOLIC PANEL
BUN: 8 mg/dL (ref 6–23)
CO2: 24 mEq/L (ref 19–32)
Calcium: 9.1 mg/dL (ref 8.4–10.5)
Creatinine, Ser: 0.67 mg/dL (ref 0.50–1.10)
Glucose, Bld: 76 mg/dL (ref 70–99)

## 2011-08-31 LAB — URINALYSIS, ROUTINE W REFLEX MICROSCOPIC
Bilirubin Urine: NEGATIVE
Glucose, UA: NEGATIVE mg/dL
Hgb urine dipstick: NEGATIVE
Ketones, ur: NEGATIVE mg/dL
Protein, ur: NEGATIVE mg/dL

## 2011-08-31 LAB — CBC
HCT: 39.3 % (ref 36.0–46.0)
MCH: 29.7 pg (ref 26.0–34.0)
MCV: 91.2 fL (ref 78.0–100.0)
RBC: 4.31 MIL/uL (ref 3.87–5.11)
WBC: 5.5 10*3/uL (ref 4.0–10.5)

## 2011-08-31 LAB — ETHANOL: Alcohol, Ethyl (B): <11 mg/dL (ref 0–11)

## 2011-08-31 LAB — DIFFERENTIAL
Eosinophils Relative: 4 % (ref 0–5)
Lymphocytes Relative: 34 % (ref 12–46)
Lymphs Abs: 1.8 10*3/uL (ref 0.7–4.0)
Monocytes Absolute: 0.7 10*3/uL (ref 0.1–1.0)
Monocytes Relative: 12 % (ref 3–12)

## 2011-08-31 LAB — RAPID URINE DRUG SCREEN, HOSP PERFORMED
Amphetamines: NOT DETECTED
Benzodiazepines: NOT DETECTED
Tetrahydrocannabinol: NOT DETECTED

## 2011-08-31 LAB — POCT PREGNANCY, URINE: Preg Test, Ur: NEGATIVE

## 2011-08-31 NOTE — ED Provider Notes (Signed)
History     CSN: [id number] Arrival date & time: 08/31/2011  8:09 AM   None     Chief Complaint  Patient presents with  . Medical Clearance    (Consider location/radiation/quality/duration/timing/severity/associated sxs/prior treatment) Patient is a 25 y.o. female presenting with drug problem. The history is provided by the patient.  Drug Problem This is a recurrent problem. Episode onset: 3 months but off and on for 2 years. The problem occurs constantly. The problem has been gradually worsening. Associated symptoms include shortness of breath. Pertinent negatives include no chest pain. Associated symptoms comments: Shortness of breath since August when she resumed smoking crack cocaine. The symptoms are aggravated by smoking. The symptoms are relieved by nothing. She has tried nothing for the symptoms.    Past Medical History  Diagnosis Date  . Genital herpes     Also Chlamydia, GC, HPV and Trich  . Mental disorder   . Bipolar 2 disorder   . ADHD (attention deficit hyperactivity disorder)   . Abnormal Pap smear     during preg, will retest after  . Abnormal Pap smear and cervical HPV (human papillomavirus)     Past Surgical History  Procedure Date  . No past surgeries     Family History  Problem Relation Age of Onset  . Diabetes Paternal Grandmother   . Heart disease Paternal Grandmother   . Hypertension Paternal Grandmother     History  Substance Use Topics  . Smoking status: Never Smoker   . Smokeless tobacco: Never Used  . Alcohol Use: No    OB History    Grav Para Term Preterm Abortions TAB SAB Ect Mult Living   9 7 7  0 2 0 2 0 0 7      Review of Systems  Respiratory: Positive for shortness of breath.   Cardiovascular: Negative for chest pain.  All other systems reviewed and are negative.    Allergies  Review of patient's allergies indicates no known allergies.  Home Medications  No current outpatient prescriptions on file.  BP 118/69   Pulse 69  Temp(Src) 98 F (36.7 C) (Oral)  Resp 20  Wt 203 lb 4.2 oz (92.2 kg)  SpO2 100%  LMP 08/29/2011  Breastfeeding? No  Physical Exam  Nursing note and vitals reviewed. Constitutional: She is oriented to person, place, and time. She appears well-developed and well-nourished. No distress.  HENT:  Head: Normocephalic and atraumatic.  Eyes: EOM are normal. Pupils are equal, round, and reactive to light.  Cardiovascular: Normal rate, regular rhythm, normal heart sounds and intact distal pulses.  Exam reveals no friction rub.   No murmur heard. Pulmonary/Chest: Effort normal and breath sounds normal. She has no wheezes. She has no rales.  Abdominal: Soft. Bowel sounds are normal. She exhibits no distension. There is no tenderness. There is no rebound and no guarding.  Musculoskeletal: Normal range of motion. She exhibits no tenderness.       No edema  Neurological: She is alert and oriented to person, place, and time. No cranial nerve deficit.  Skin: Skin is warm and dry. No rash noted.  Psychiatric: Her behavior is normal. Judgment normal. Cognition and memory are normal. She exhibits a depressed mood. She expresses no homicidal and no suicidal ideation. She expresses no suicidal plans and no homicidal plans.       States she feels that nobody cares and she is alone.    ED Course  Procedures (including critical care time)  Results for  orders placed during the hospital encounter of 08/31/11  CBC      Component Value Range   WBC 5.5  4.0 - 10.5 (K/uL)   RBC 4.31  3.87 - 5.11 (MIL/uL)   Hemoglobin 12.8  12.0 - 15.0 (g/dL)   HCT 16.1  09.6 - 04.5 (%)   MCV 91.2  78.0 - 100.0 (fL)   MCH 29.7  26.0 - 34.0 (pg)   MCHC 32.6  30.0 - 36.0 (g/dL)   RDW 40.9  81.1 - 91.4 (%)   Platelets 341  150 - 400 (K/uL)  DIFFERENTIAL      Component Value Range   Neutrophils Relative 51  43 - 77 (%)   Neutro Abs 2.8  1.7 - 7.7 (K/uL)   Lymphocytes Relative 34  12 - 46 (%)   Lymphs Abs 1.8   0.7 - 4.0 (K/uL)   Monocytes Relative 12  3 - 12 (%)   Monocytes Absolute 0.7  0.1 - 1.0 (K/uL)   Eosinophils Relative 4  0 - 5 (%)   Eosinophils Absolute 0.2  0.0 - 0.7 (K/uL)   Basophils Relative 0  0 - 1 (%)   Basophils Absolute 0.0  0.0 - 0.1 (K/uL)  BASIC METABOLIC PANEL      Component Value Range   Sodium 137  135 - 145 (mEq/L)   Potassium 4.0  3.5 - 5.1 (mEq/L)   Chloride 107  96 - 112 (mEq/L)   CO2 24  19 - 32 (mEq/L)   Glucose, Bld 76  70 - 99 (mg/dL)   BUN 8  6 - 23 (mg/dL)   Creatinine, Ser 7.82  0.50 - 1.10 (mg/dL)   Calcium 9.1  8.4 - 95.6 (mg/dL)   GFR calc non Af Amer >90  >90 (mL/min)   GFR calc Af Amer >90  >90 (mL/min)  URINALYSIS, ROUTINE W REFLEX MICROSCOPIC      Component Value Range   Color, Urine YELLOW  YELLOW    APPearance CLEAR  CLEAR    Specific Gravity, Urine 1.028  1.005 - 1.030    pH 6.0  5.0 - 8.0    Glucose, UA NEGATIVE  NEGATIVE (mg/dL)   Hgb urine dipstick NEGATIVE  NEGATIVE    Bilirubin Urine NEGATIVE  NEGATIVE    Ketones, ur NEGATIVE  NEGATIVE (mg/dL)   Protein, ur NEGATIVE  NEGATIVE (mg/dL)   Urobilinogen, UA 1.0  0.0 - 1.0 (mg/dL)   Nitrite NEGATIVE  NEGATIVE    Leukocytes, UA NEGATIVE  NEGATIVE   ETHANOL      Component Value Range   Alcohol, Ethyl (B) <11  0 - 11 (mg/dL)  URINE RAPID DRUG SCREEN (HOSP PERFORMED)      Component Value Range   Opiates NONE DETECTED  NONE DETECTED    Cocaine POSITIVE (*) NONE DETECTED    Benzodiazepines NONE DETECTED  NONE DETECTED    Amphetamines NONE DETECTED  NONE DETECTED    Tetrahydrocannabinol NONE DETECTED  NONE DETECTED    Barbiturates NONE DETECTED  NONE DETECTED   POCT PREGNANCY, URINE      Component Value Range   Preg Test, Ur NEGATIVE     No results found.    No diagnosis found.    MDM  Patient here because she states she has a drug problem. States since mid August she has been smoking cocaine every day. Patient does have a history of bipolar disease and is supposed to be on  Lamictal however she is not taking it.  She says right now she feels more manic to where she has lots of energy and that's why she feels like she is smoking the cocaine however she also has elements of depression and she says she feels like nobody cares and she feels all alone. However she denies any suicidal ideation. She has no physical complaints except for being short of breath since she restarted smoking crack cocaine. Lungs are clear on exam today. Will  check medical clearance labs and breath sounds clear  Here with normal sats and no reason for film at this time.  Labs wnl.  Pt seen by Al[place]ia and d/ced home.       Gwyneth Sprout, MD 08/31/11 1909

## 2011-08-31 NOTE — BH Assessment (Signed)
This Clinical research associate has reviewed note and is in agreement with disposition and contents.  Ileene Hutchinson , MSW, LCSWA 08/31/2011 2:36 PM

## 2011-08-31 NOTE — ED Notes (Signed)
Pt placed in paper scrubs, Security has wanded pt & belongings. 1 bag & is locked in Triage Med Roomcabinet

## 2011-08-31 NOTE — Consult Note (Signed)
Patient Identification:  Regina Coleman Date of Evaluation:  08/31/2011   History of Present Illness: I reviewed assessment done by social worker as below and I saw the patient along with her Regina Coleman is an 25 y.o. female requesting detox and treatment of long term crack cocaine use. She started using cocaine at the age of 79 and started using crack cocaine 3 years ago (smoking). She now uses approximately 2 20's per week.Last use was 2 days ago per her report. Patient has 6 children-1 boy, 5 girls. Youngest child was born in July 2012. Patient states that she did not use during this last pregnancy but DSS has removed all the children from her care (including this baby).She is upset about this as she states that both she and the baby tested clean. Patient had been attending classes at [facility] 2 times a week but the services were discontinued. She has not followed up with any other program. Patient reports that her use is exacerbated by boredom- she is unemployed, not in school and is not allowed to see her children. She requests outpatient services as she does not feel that she responds well to being "locked in". Patient has history of inpatient treatments since the age of 63 and has also been in a group home. She states that she has been diagnosed with Bipolar I, Manic depression, mood disorder. She reports that she is feeling very hyper now; visibly restless during interview. Patient relates that her family is "tired" of her and insisting that she get help. She relates that she is also tired and wants to get help; she wants to be able to see her children. Patient is currently living with her boyfriend's mother- he is father of her last child. Her supports come from her aunt, boyfriend, his mother, cousins and her grandmother. Patient reports depression because "no one cares about me because I keep using-they tell me I'm going to die if I keep using." She is requesting outpatient services but  wants help to control her "mania." Patient had been prescribed Lamictal but she had not been taking it as she was afraid to mix it with the crack (worried about side affects). She is interested in seeking services as an outpatient. Will return home with her BF's mother at d/c from the hospital.  Patient has a long history of cocaine abuse long history of PTSD and mood disorder. Currently patient is logical and goal-directed not hallucinating or delusional not suicidal or homicidal. We discussed with her different options available in the outpatient setting patient to followup at [facility] and I told her about the substance abuse intensive outpatient program. Patient at this time does not want to be admitted in the inpatient setting [facility].   Past Medical History:     Past Medical History  Diagnosis Date  . Genital herpes     Also Chlamydia, GC, HPV and Trich  . Mental disorder   . Bipolar 2 disorder   . ADHD (attention deficit hyperactivity disorder)   . Abnormal Pap smear     during preg, will retest after  . Abnormal Pap smear and cervical HPV (human papillomavirus)        Past Surgical History  Procedure Date  . No past surgeries     Allergies: No Known Allergies  Current Medications:  Prior to Admission medications   Not on File    Social History:    reports that she has never smoked. She has never used smokeless tobacco.  She reports that she uses illicit drugs (Cocaine). She reports that she does not drink alcohol.   Family History:    Family History  Problem Relation Age of Onset  . Diabetes Paternal Grandmother   . Heart disease Paternal Grandmother   . Hypertension Paternal Grandmother      DIAGNOSIS:   AXIS I  Chronic cocaine dependence, mood disorder NOS, anxiety disorder NOS   AXIS II  Deffered  AXIS III See medical notes.  AXIS IV  substance abuse   AXIS V 60     Recommendations: Patient will be discharged and at this time to followup in the  outpatient setting she does not meet criteria to be admitted ON IVC.    Eulogio Ditch, MD
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
## 2011-08-31 NOTE — Progress Notes (Signed)
Writer is in agreement with disposition and contents listed above.  Ileene Hutchinson , MSW, LCSWA 08/31/2011 2:36 PM

## 2011-08-31 NOTE — ED Notes (Signed)
Locked one patient belonging bag up in activity room.

## 2011-08-31 NOTE — BH Assessment (Addendum)
Assessment Note   Regina Coleman is an 25 y.o. female requesting detox and treatment of long term crack cocaine use. She started using cocaine at the age of 85 and started using crack cocaine 3 years ago (smoking).  She now uses approximately 2 20's per week.Last use was 2 days ago per her report. Patient has 6 children-1 boy, 5 girls. Youngest child was born in July 2012. Patient states that she did not use during this last pregnancy but DSS has removed all the children from her care (including this baby).She is upset about this as she states that both she and the baby tested clean.   Patient had been attending classes at [facility] 2 times a week but the services were discontinued. She has not followed up with any other program. Patient reports that her use is exacerbated by boredom- she is unemployed, not in school and is not allowed to see her children.  She requests outpatient services as she does not feel that she responds well to being "locked in".  Patient has history of inpatient treatments since the age of 80 and has also been in a group home. She states that she has been diagnosed with Bipolar I, Manic depression, mood disorder. She reports that she is feeling very hyper now; visibly restless during interview. Patient relates that her family is "tired" of her and insisting that she get help. She relates that she is also tired and wants to get help; she wants to be able to see her children.  Patient is currently living with her boyfriend's mother- he is father of her last child. Her supports come from her aunt, boyfriend, his mother, cousins  and her grandmother. Patient reports depression because "no one cares about me because I keep using-they tell me I'm going to die if I keep using."  She is requesting outpatient services but wants help to control her "mania."  Patient had been prescribed Lamictal but she had not been taking it as she was afraid to mix it with the crack (worried about side affects).   She is interested in seeking services as an outpatient. Will return home with her BF's mother at d/c from the hospital.  Axis I: cocaine dependence, Depression, Mood Disorder, NOS Axis II: Deferred Axis III:  Past Medical History  Diagnosis Date  . Genital herpes     Also Chlamydia, GC, HPV and Trich  . Mental disorder   . Bipolar 2 disorder   . ADHD (attention deficit hyperactivity disorder)   . Abnormal Pap smear     during preg, will retest after  . Abnormal Pap smear and cervical HPV (human papillomavirus)    Axis IV: limited social support,employment, finances Axis V: 51-60 moderate symptoms  Past Medical History:  Past Medical History  Diagnosis Date  . Genital herpes     Also Chlamydia, GC, HPV and Trich  . Mental disorder   . Bipolar 2 disorder   . ADHD (attention deficit hyperactivity disorder)   . Abnormal Pap smear     during preg, will retest after  . Abnormal Pap smear and cervical HPV (human papillomavirus)     Past Surgical History  Procedure Date  . No past surgeries     Family History:  Family History  Problem Relation Age of Onset  . Diabetes Paternal Grandmother   . Heart disease Paternal Grandmother   . Hypertension Paternal Grandmother     Social History:  reports that she has never smoked. She has  never used smokeless tobacco. She reports that she uses illicit drugs (Cocaine). She reports that she does not drink alcohol.  Allergies: No Known Allergies  Home Medications:  No current facility-administered medications on file as of 08/31/2011.   No current outpatient prescriptions on file as of 08/31/2011.    OB/GYN Status:  Patient's last menstrual period was 08/29/2011.  General Assessment Data Assessment Number: 1  Living Arrangements: Non-Relatives (Lives with BF's mother) Can pt return to current living arrangement?: Yes Admission Status: Voluntary Is patient capable of signing voluntary admission?: Yes Transfer from:  Home Referral Source: Self/Family/Friend  Risk to self Suicidal Ideation: No Suicidal Intent: No Is patient at risk for suicide?: No Suicidal Plan?: No Access to Means: No What has been your use of drugs/alcohol within the last 12 months?: Crack(smokes) daily since July, 2012  none for past 2 days Triggers for Past Attempts: None known Intentional Self Injurious Behavior: None Family Suicide History: No Recent stressful life event(s): Conflict (Comment) (Family is upset with continued drug use.) Persecutory voices/beliefs?: No Depression: Yes Depression Symptoms: Loss of interest in usual pleasures;Feeling worthless/self pity Substance abuse history and/or treatment for substance abuse?: Yes Suicide prevention information given to non-admitted patients: Not applicable  Risk to Others Homicidal Ideation: No Thoughts of Harm to Others: No Current Homicidal Intent: No Current Homicidal Plan: No Access to Homicidal Means: No History of harm to others?: Yes (Fought alot when younger) Assessment of Violence: None Noted Criminal Charges Pending?: No Does patient have a court date: No  Mental Status Report Appear/Hygiene:  (Neat, appropriate) Eye Contact: Fair Motor Activity: Hyperactivity;Restlessness Speech: Rapid Level of Consciousness: Alert Mood: Anxious;Sad;Ashamed/humiliated Affect: Anxious (Patient states "I feel manic") Anxiety Level: Panic Attacks Panic attack frequency: 2 x's a week Most recent panic attack: yesterday Thought Processes: Relevant Judgement: Impaired Orientation: Person;Time;Place;Situation Obsessive Compulsive Thoughts/Behaviors: None  Cognitive Functioning Concentration: Normal Memory: Remote Intact;Recent Intact IQ: Average Insight: Poor Impulse Control: Poor Appetite: Good Sleep: Increased Total Hours of Sleep:  (states she can sleep all day/all night- feels dep. is cause) Vegetative Symptoms: None  Prior Inpatient/Outpatient  Therapy Prior Therapy: Inpatient Prior Therapy Dates: 2007, 2008, 2010, 2012 Prior Therapy Facilty/Provider(s): [facility], [place], [place], [place] Reason for Treatment: Behavioral and SA            Values / Beliefs Cultural Requests During Hospitalization: None Spiritual Requests During Hospitalization: None        Additional Information 1:1 In Past 12 Months?: No CIRT Risk: No Elopement Risk: No Does patient have medical clearance?: Yes     Disposition:  Dr. Rogers Blocker and this writer met with patient for assessment. A thorough discussion of benefits of inpatient verses outpatient treatment given to patient. She states that her boyfriend and aunt want her to seek inpatient care but she does not wish to do this. Patient feels that she will do better with outpatient services. Patient will be d/c'd home today with recommendation for follow up at [facility] tomorrow. Resource information provided to patient regarding outpatient services with instructions to follow up tomorrow.  Bradly Bienenstock, SW Intern 08/31/2011 2:43 PM    On Site Evaluation by:   Reviewed with Physician:     Darylene Price 08/31/2011 12:54 PM BSW Student Intern

## 2011-08-31 NOTE — ED Notes (Signed)
Report given to Mike, RN

## 2011-08-31 NOTE — ED Notes (Signed)
Pt states "my family called around and was told to come to [facility], I have a problem with cocaine, have been using since I had my daughter, probably mid-August"

## 2011-08-31 NOTE — ED Notes (Signed)
Pt states she has been using cocaine and wishes to get help getting off of it.  States she feels like she could benefit from outpatient tx.  Denies SI/HI.  Last used two days ago.

## 2011-08-31 NOTE — ED Notes (Signed)
MD aware pt needs assessment

## 2011-12-20 ENCOUNTER — Emergency Department (HOSPITAL_COMMUNITY)
Admission: EM | Admit: 2011-12-20 | Discharge: 2011-12-21 | Disposition: A | Discharge status: Home / Self Care | Payer: Self-pay | Attending: Emergency Medicine | Admitting: Emergency Medicine

## 2011-12-20 ENCOUNTER — Encounter (HOSPITAL_COMMUNITY): Payer: Self-pay

## 2011-12-20 ENCOUNTER — Emergency Department (HOSPITAL_COMMUNITY)
Admission: EM | Admit: 2011-12-20 | Discharge: 2011-12-20 | Discharge status: Against Medical Advice | Payer: Self-pay | Attending: Emergency Medicine | Admitting: Emergency Medicine

## 2011-12-20 DIAGNOSIS — L0390 Cellulitis, unspecified: Secondary | ICD-10-CM

## 2011-12-20 DIAGNOSIS — L539 Erythematous condition, unspecified: Secondary | ICD-10-CM | POA: Insufficient documentation

## 2011-12-20 DIAGNOSIS — F319 Bipolar disorder, unspecified: Secondary | ICD-10-CM | POA: Insufficient documentation

## 2011-12-20 DIAGNOSIS — F909 Attention-deficit hyperactivity disorder, unspecified type: Secondary | ICD-10-CM | POA: Insufficient documentation

## 2011-12-20 DIAGNOSIS — L02619 Cutaneous abscess of unspecified foot: Secondary | ICD-10-CM | POA: Insufficient documentation

## 2011-12-20 DIAGNOSIS — A6000 Herpesviral infection of urogenital system, unspecified: Secondary | ICD-10-CM | POA: Insufficient documentation

## 2011-12-20 LAB — BASIC METABOLIC PANEL WITH GFR
BUN: 11 mg/dL (ref 6–23)
CO2: 29 meq/L (ref 19–32)
Calcium: 9.3 mg/dL (ref 8.4–10.5)
Chloride: 105 meq/L (ref 96–112)
Creatinine, Ser: 0.64 mg/dL (ref 0.50–1.10)
GFR calc Af Amer: >90 mL/min
GFR calc non Af Amer: >90 mL/min
Glucose, Bld: 93 mg/dL (ref 70–99)
Potassium: 4.1 meq/L (ref 3.5–5.1)
Sodium: 140 meq/L (ref 135–145)

## 2011-12-20 LAB — POCT PREGNANCY, URINE: Preg Test, Ur: NEGATIVE

## 2011-12-20 LAB — CBC
HCT: 37.9 % (ref 36.0–46.0)
MCHC: 32.7 g/dL (ref 30.0–36.0)
RDW: 13.1 % (ref 11.5–15.5)

## 2011-12-20 LAB — DIFFERENTIAL
Basophils Absolute: 0 10*3/uL (ref 0.0–0.1)
Basophils Relative: 0 % (ref 0–1)
Eosinophils Absolute: 0.1 10*3/uL (ref 0.0–0.7)
Eosinophils Relative: 2 % (ref 0–5)
Lymphocytes Relative: 34 % (ref 12–46)
Monocytes Absolute: 1 10*3/uL (ref 0.1–1.0)

## 2011-12-20 MED ORDER — SULFAMETHOXAZOLE-TMP DS 800-160 MG PO TABS: 1.0000 | ORAL_TABLET | Freq: Once | ORAL | Status: AC

## 2011-12-20 MED ORDER — SULFAMETHOXAZOLE-TMP DS 800-160 MG PO TABS
1.0000 | ORAL_TABLET | Freq: Once | ORAL | Status: AC
  Administered 2011-12-20: 1 via ORAL
  Filled 2011-12-20: qty 1

## 2011-12-20 MED ORDER — CLINDAMYCIN PHOSPHATE 600 MG/50ML IV SOLN: 600.0000 mg | Freq: Once | INTRAVENOUS | Status: DC

## 2011-12-20 NOTE — ED Notes (Signed)
Pt reports has had athletes foot for approx one month and has been treating it with powder and creams.  Reports had been scratching foot and now has area of infection.  Foot red and swollen, wound draining.

## 2011-12-20 NOTE — Discharge Instructions (Signed)

## 2011-12-20 NOTE — ED Notes (Signed)
Pt has infection with drainage to between 1st and 2nd toes of left foot, seen here earlier today but had to leave before treatment completed

## 2011-12-21 NOTE — Discharge Instructions (Signed)
Cellulitis Cellulitis is an infection of the tissue under the skin. The infected area is usually red and tender. This is caused by germs. These germs enter the body through cuts or sores. This usually happens in the arms or lower legs. HOME CARE   Take your medicine as told. Finish it even if you start to feel better.   If the infection is on the arm or leg, keep it raised (elevated).   Use a warm cloth on the infected area several times a day.   See your doctor for a follow-up visit as told.  GET HELP RIGHT AWAY IF:   You are tired or confused.   You throw up (vomit).   You have watery poop (diarrhea).   You feel ill and have muscle aches.   You have a fever.  MAKE SURE YOU:   Understand these instructions.   Will watch your condition.   Will get help right away if you are not doing well or get worse.  Document Released: 03/06/2008 Document Revised: 09/07/2011 Document Reviewed: 08/20/2009 Exit[employer] Patient Information 2012 [employer]. 

## 2011-12-21 NOTE — ED Provider Notes (Signed)
History     CSN: [id number]  Arrival date & time 12/20/11  2343   First MD Initiated Contact with Patient 12/21/11 0003      Chief Complaint  Patient presents with  . Wound Infection    (Consider location/radiation/quality/duration/timing/severity/associated sxs/prior treatment) HPI The patient states she's been having trouble with athlete's foot on her left foot between her first and second toes for at least the last month. States she's been applying creams without relief. Recently she started having more redness and drainage between her toes. Patient states she was seen in the emergency department earlier today but had to leave to pick up her child. She states she was told she might need IV antibiotics. Patient states the pain increases with movement and palpation.  She describes it as an 8/10. She has not had any fevers or chills. Past Medical History  Diagnosis Date  . Genital herpes     Also Chlamydia, GC, HPV and Trich  . Mental disorder   . Bipolar 2 disorder     pt says r/o bipolar.  . ADHD (attention deficit hyperactivity disorder)   . Abnormal Pap smear     during preg, will retest after  . Abnormal Pap smear and cervical HPV (human papillomavirus)     Past Surgical History  Procedure Date  . No past surgeries     Family History  Problem Relation Age of Onset  . Diabetes Paternal Grandmother   . Heart disease Paternal Grandmother   . Hypertension Paternal Grandmother     History  Substance Use Topics  . Smoking status: Never Smoker   . Smokeless tobacco: Never Used  . Alcohol Use: No    OB History    Grav Para Term Preterm Abortions TAB SAB Ect Mult Living   9 7 7  0 2 0 2 0 0 7      Review of Systems  All other systems reviewed and are negative.    Allergies  Review of patient's allergies indicates no known allergies.  Home Medications   Current Outpatient Rx  Name Route Sig Dispense Refill  . IBUPROFEN 200 MG PO TABS Oral Take 400 mg by  mouth as needed. For pain    . SULFAMETHOXAZOLE-TMP DS 800-160 MG PO TABS Oral Take 1 tablet by mouth once. 20 tablet 0    BP 119/61  Pulse 87  Temp(Src) 97.9 F (36.6 C) (Oral)  Resp 18  Ht 5\' 3"  (1.6 m)  Wt 160 lb (72.576 kg)  BMI 28.34 kg/m2  SpO2 99%  LMP 12/03/2011  Physical Exam  Nursing note and vitals reviewed. Constitutional: She appears well-developed and well-nourished. No distress.  HENT:  Head: Normocephalic and atraumatic.  Right Ear: External ear normal.  Left Ear: External ear normal.  Eyes: Conjunctivae are normal. Right eye exhibits no discharge. Left eye exhibits no discharge. No scleral icterus.  Neck: Neck supple. No tracheal deviation present.  Cardiovascular: Normal rate.   Pulmonary/Chest: Effort normal. No stridor. No respiratory distress.  Musculoskeletal: She exhibits edema and tenderness.       Crusting purulent drainage between the first and second toe of the left foot, mild erythema of the dorsum of the foot, no lymphangitic streaking, no fluctuance  Neurological: She is alert. Cranial nerve deficit: no gross deficits.  Skin: Skin is warm and dry. No rash noted.  Psychiatric: She has a normal mood and affect.    ED Course  Procedures (including critical care time)  Labs Reviewed - No  data to display No results found.    MDM  She appears to have a cellulitis of her foot. There does not appear to be evidence of significant spread or bony involvement. I will discuss local wound care. I will have her continue the antibiotics that she was prescribed earlier today. She will need to followup and have the wound rechecked in a few days to make sure it is improving. There is a fair amount of crusting discharged. I've discussed with patient the importance of good local wound care.        Celene Kras, MD 12/21/11 [phone]

## 2011-12-21 NOTE — ED Provider Notes (Signed)
History     CSN: [id number]  Arrival date & time 12/20/11  1619   First MD Initiated Contact with Patient 12/20/11 1657      Chief Complaint  Patient presents with  . Abscess    (Consider location/radiation/quality/duration/timing/severity/associated sxs/prior treatment) HPI Comments: Patient presents with a one month history of athlete's foot on her left foot which has been treated with OTC powders and athlete's foot cream without relief.  She states she has been scratching her foot and between her toes, causing an abrasion which is now become infected.  She has had increased pain, swelling and drainage from her wound on her left web space between her great and second toe.  Patient is a 26 y.o. female presenting with abscess. The history is provided by the patient.  Abscess  This is a new problem. The current episode started less than one week ago. The onset was gradual. The problem occurs continuously. The problem has been gradually worsening. Pertinent negatives include no fever, no congestion and no sore throat. She has received no recent medical care.    Past Medical History  Diagnosis Date  . Genital herpes     Also Chlamydia, GC, HPV and Trich  . Mental disorder   . Bipolar 2 disorder     pt says r/o bipolar.  . ADHD (attention deficit hyperactivity disorder)   . Abnormal Pap smear     during preg, will retest after  . Abnormal Pap smear and cervical HPV (human papillomavirus)     Past Surgical History  Procedure Date  . No past surgeries     Family History  Problem Relation Age of Onset  . Diabetes Paternal Grandmother   . Heart disease Paternal Grandmother   . Hypertension Paternal Grandmother     History  Substance Use Topics  . Smoking status: Never Smoker   . Smokeless tobacco: Never Used  . Alcohol Use: No    OB History    Grav Para Term Preterm Abortions TAB SAB Ect Mult Living   9 7 7  0 2 0 2 0 0 7      Review of Systems  Constitutional:  Negative for fever.  HENT: Negative for congestion, sore throat and neck pain.   Eyes: Negative.   Respiratory: Negative for chest tightness and shortness of breath.   Cardiovascular: Negative for chest pain.  Gastrointestinal: Negative for nausea and abdominal pain.  Genitourinary: Negative.   Musculoskeletal: Negative for joint swelling and arthralgias.  Skin: Positive for color change and wound. Negative for rash.  Neurological: Negative for dizziness, weakness, light-headedness, numbness and headaches.  Hematological: Negative.   Psychiatric/Behavioral: Negative.     Allergies  Review of patient's allergies indicates no known allergies.  Home Medications   Current Outpatient Rx  Name Route Sig Dispense Refill  . IBUPROFEN 200 MG PO TABS Oral Take 400 mg by mouth as needed. For pain    . SULFAMETHOXAZOLE-TMP DS 800-160 MG PO TABS Oral Take 1 tablet by mouth once. 20 tablet 0    BP 116/80  Pulse 87  Temp(Src) 98 F (36.7 C) (Oral)  Resp 18  Ht 5\' 3"  (1.6 m)  Wt 180 lb (81.647 kg)  BMI 31.89 kg/m2  SpO2 100%  LMP 12/03/2011  Physical Exam  Nursing note and vitals reviewed. Constitutional: She is oriented to person, place, and time. She appears well-developed and well-nourished.  HENT:  Head: Normocephalic and atraumatic.  Eyes: Conjunctivae are normal.  Neck: Neck supple.  Cardiovascular:  Normal rate and intact distal pulses.   Pulmonary/Chest: Effort normal.  Musculoskeletal: Normal range of motion.  Neurological: She is alert and oriented to person, place, and time.  Skin: Skin is warm and dry. There is erythema.       Edema, tenderness and slight erythema of left foot dorsum.  Crusting and purulent drainage from between the first and second toe of the left foot.  No induration or fluctuance, no lymphatic streaking.  Psychiatric: She has a normal mood and affect.    ED Course  Procedures (including critical care time)  Labs Reviewed  DIFFERENTIAL - Abnormal;  Notable for the following:    Monocytes Relative 13 (*)    All other components within normal limits  CBC  BASIC METABOLIC PANEL  POCT PREGNANCY, URINE   No results found.   1. Cellulitis     IV antibiotics was recommended to patient, however she was in a hurry to leave as she left her 26-month-old child with her grandmother whom she needed to get home to.  She was given her first dose of Bactrim DS prior to discharge home, prescription given for same.  Encouraged to keep her foot in wound area clean and dry, elevated.  Also suggested using warm Epsom salt soaks.  Strongly encouraged patient to return for IV antibiotics, definitely return for recheck if her symptoms worsen in any way.  MDM  Lab studies were not resulted at time of patient's AMA discharge home.  No significant finding on labs.  Cellulitis margins were marked with skin marker pen.  Doubt drainable abscess since no fluctuance or significant edema appreciated.        Candis Musa, PA 12/21/11 0050  Candis Musa, PA 12/21/11 [phone]

## 2011-12-21 NOTE — ED Notes (Signed)
Wound irrigated and Dr. Lynelle Doctor at bedside to evaluate wound.  Order received to bandage with sterile bandage.

## 2011-12-21 NOTE — ED Provider Notes (Signed)
Medical screening examination/treatment/procedure(s) were performed by non-physician practitioner and as supervising physician I was immediately available for consultation/collaboration.  Flint Melter, MD 12/21/11 [phone]

## 2011-12-21 NOTE — ED Notes (Signed)
Sterile gauze placed between left great and second toes and foot wrapped with kerlix.  Discharge instructions given and reviewed with patient.  Patient instructed to continue antibiotic already prescribed.  Patient verbalized understanding to follow up with her PMD in 2 days to re-evaluate wound.

## 2012-04-24 ENCOUNTER — Inpatient Hospital Stay (HOSPITAL_COMMUNITY): Payer: Self-pay

## 2012-04-24 ENCOUNTER — Encounter (HOSPITAL_COMMUNITY): Payer: Self-pay

## 2012-04-24 ENCOUNTER — Inpatient Hospital Stay (HOSPITAL_COMMUNITY)
Admission: AD | Admit: 2012-04-24 | Discharge: 2012-04-24 | Disposition: A | Discharge status: Home / Self Care | Payer: Self-pay | Source: Ambulatory Visit | Attending: Obstetrics & Gynecology | Admitting: Obstetrics & Gynecology

## 2012-04-24 DIAGNOSIS — O99891 Other specified diseases and conditions complicating pregnancy: Secondary | ICD-10-CM | POA: Insufficient documentation

## 2012-04-24 DIAGNOSIS — O26899 Other specified pregnancy related conditions, unspecified trimester: Secondary | ICD-10-CM

## 2012-04-24 DIAGNOSIS — O210 Mild hyperemesis gravidarum: Secondary | ICD-10-CM | POA: Insufficient documentation

## 2012-04-24 DIAGNOSIS — R109 Unspecified abdominal pain: Secondary | ICD-10-CM | POA: Insufficient documentation

## 2012-04-24 HISTORY — DX: Major depressive disorder, single episode, unspecified: F32.9

## 2012-04-24 HISTORY — DX: Anemia, unspecified: D64.9

## 2012-04-24 HISTORY — DX: Depression, unspecified: F32.A

## 2012-04-24 LAB — CBC
Hemoglobin: 11.6 g/dL — ABNORMAL LOW (ref 12.0–15.0)
MCH: 29.4 pg (ref 26.0–34.0)
MCV: 88.6 fL (ref 78.0–100.0)
Platelets: 296 10*3/uL (ref 150–400)
RBC: 3.94 MIL/uL (ref 3.87–5.11)
WBC: 7 10*3/uL (ref 4.0–10.5)

## 2012-04-24 LAB — HCG, SERUM, QUALITATIVE: Preg, Serum: POSITIVE — AB

## 2012-04-24 LAB — URINALYSIS, ROUTINE W REFLEX MICROSCOPIC
Bilirubin Urine: NEGATIVE
Ketones, ur: NEGATIVE mg/dL
Nitrite: NEGATIVE
Protein, ur: NEGATIVE mg/dL
pH: 6 (ref 5.0–8.0)

## 2012-04-24 LAB — RAPID URINE DRUG SCREEN, HOSP PERFORMED
Amphetamines: NOT DETECTED
Tetrahydrocannabinol: NOT DETECTED

## 2012-04-24 LAB — WET PREP, GENITAL
Clue Cells Wet Prep HPF POC: NONE SEEN
Trich, Wet Prep: NONE SEEN

## 2012-04-24 LAB — URINE MICROSCOPIC-ADD ON

## 2012-04-24 MED ORDER — PROMETHAZINE HCL 25 MG PO TABS: 25.0000 mg | ORAL_TABLET | Freq: Four times a day (QID) | ORAL | Status: DC | PRN

## 2012-04-24 MED ORDER — PRENATAL VITAMINS 28-0.8 MG PO TABS: 1.0000 | ORAL_TABLET | ORAL | Status: DC

## 2012-04-24 MED ORDER — ONDANSETRON 8 MG PO TBDP: 8.0000 mg | ORAL_TABLET | Freq: Three times a day (TID) | ORAL | Status: AC | PRN

## 2012-04-24 NOTE — MAU Note (Signed)
Patient states she has been cramping since yesterday. Has been nauseated since yesterday but no vomiting. No bleeding or discharge.

## 2012-04-24 NOTE — MAU Note (Addendum)
Note entered on wrong pt.

## 2012-04-24 NOTE — MAU Provider Note (Signed)
Attestation of Attending Supervision of Advanced Practitioner (CNM/NP): Evaluation and management procedures were performed by the Advanced Practitioner under my supervision and collaboration.  I have reviewed the Advanced Practitioner's note and chart, and I agree with the management and plan.  Jaynie Collins, M.D. 04/24/2012 1:15 PM

## 2012-04-24 NOTE — Discharge Instructions (Signed)
    ________________________________________     To schedule your Maternity Eligibility Appointment, please call [phone].  When you arrive for your appointment you must bring the following items or information listed below.  Your appointment will be rescheduled if you do not have these items or are 15 minutes late. If currently receiving Medicaid, you MUST bring: 1. Medicaid Card 2. Social Security Card 3. Picture ID 4. Proof of Pregnancy 5. Verification of current address if the address on Medicaid card is incorrect "postmarked mail" If not receiving Medicaid, you MUST bring: 1. Social Security Card 2. Picture ID 3. Birth Certificate (if available) Passport or *Green Card 4. Proof of Pregnancy 5. Verification of current address "postmarked mail" for each income presented. 6. Verification of insurance coverage, if any 7. Check stubs from each employer for the previous month (if unable to present check stub  for each week, we will accept check stub for the first and last week ill the same month.) If you can't locate check stubs, you must bring a letter from the employer(s) and it must have the following information on letterhead, typed, in English: o name of company o company telephone number o how long been with the company, if less than one month o how much person earns per hour o how many hours per week work o the gross pay the person earned for the previous month If you are 25 years old or less, you do not have to bring proof of income unless you work or live with the father of the baby and at that time we will need proof of income from you and/or the father of the baby. Green Card recipients are eligible for Medicaid for Pregnant Women (MPW)    

## 2012-04-24 NOTE — MAU Provider Note (Signed)
History  CSN: [id number] Arrival date and time: 04/24/12 4098  First Provider Initiated Contact with Patient 04/24/12 1043     Chief Complaint  Patient presents with  . Possible Pregnancy  . Nausea  . Abdominal Cramping   HPI Patient is a 26 yo woman, [id number] @ unknown weeks who reports her LMP 01/28/12 but no FHT found in MAU. 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 She reports that within the last 5-6 hours she has only had one episode of this pain. She says pushing her hand on her left side makes the pain feel better, she has been taking ibuprofen for some dental pain but it has not affected her abdominal pain. Patient also complains of nausea with no vomiting although she induced vomitus yesterday which was normal in consistency and color with no hematemesis noted. She also reports constipation for a couple of weeks and her last BM was this morning with no straining and no h/o hemorrhoids, no blood noted in her stool as well. Patient also states she has been using cocaine and her last use was 2 days ago, although now that she knows she is pregnant she plans to quit. She otherwise has no other complaints and denies fevers, chills, night sweats.    OB History    Grav Para Term Preterm Abortions TAB SAB Ect Mult Living   10 7 7  0 2 0 2 0 0 7      Past Medical History  Diagnosis Date  . Genital herpes     Also Chlamydia, GC, HPV and Trich  . Mental disorder   . Bipolar 2 disorder     pt says r/o bipolar.  . ADHD (attention deficit hyperactivity disorder)   . Abnormal Pap smear     during preg, will retest after  . Abnormal Pap smear and cervical HPV (human papillomavirus)   . Anemia   . Depression     Past Surgical History  Procedure Date  . No past surgeries     Family History  Problem Relation Age of Onset  .  Diabetes Paternal Grandmother   . Heart disease Paternal Grandmother   . Hypertension Paternal Grandmother   . Other Neg Hx     History  Substance Use Topics  . Smoking status: Never Smoker   . Smokeless tobacco: Never Used  . Alcohol Use: No    Allergies: No Known Allergies  Prescriptions prior to admission  Medication Sig Dispense Refill  . ibuprofen (ADVIL,MOTRIN) 200 MG tablet Take 400 mg by mouth as needed. For pain        Review of Systems  Constitutional: Negative for fever, chills and malaise/fatigue.  HENT:       Tooth pain 2/2 cavity filling fell off  Eyes: Negative.   Respiratory: Negative.   Cardiovascular: Negative.   Gastrointestinal: Positive for nausea, vomiting, abdominal pain ( lower left) and constipation.  Genitourinary: Negative.   Musculoskeletal: Negative.   Skin: Negative.   Neurological: Negative.   Endo/Heme/Allergies: Negative.   Psychiatric/Behavioral: Negative.    Physical Exam   Blood pressure 119/65, pulse 120, temperature 98.3 F (36.8 C), temperature source Oral, resp. rate 16, height 5\' 4"  (1.626 m), weight 72.213 kg (159 lb 3.2 oz), last menstrual period 01/28/2012, SpO2 100.00%, unknown if currently breastfeeding.  Physical Exam  Constitutional: She is oriented  to person, place, and time. She appears well-developed and well-nourished.  HENT:  Head: Normocephalic and atraumatic.  Eyes: Conjunctivae are normal. Pupils are equal, round, and reactive to light.  Neck: Normal range of motion. Neck supple.  Cardiovascular: Regular rhythm and normal heart sounds.  Exam reveals no friction rub.   No murmur heard. Respiratory: Effort normal and breath sounds normal.  GI: Soft. Bowel sounds are normal. There is tenderness.  Genitourinary: Uterus is enlarged ( 6-8 week uterus). Uterus is not tender. Cervix exhibits no motion tenderness, no discharge and no friability. Right adnexum displays no mass and no tenderness. Left adnexum displays no  mass and no tenderness.       No palpable adnexal mass / tenderness  Musculoskeletal: Normal range of motion.  Neurological: She is alert and oriented to person, place, and time.  Skin: Skin is warm and dry.  Psychiatric: She has a normal mood and affect. Her behavior is normal. Judgment and thought content normal.    Results for orders placed during the hospital encounter of 04/24/12 (from the past 24 hour(s))  URINALYSIS, ROUTINE W REFLEX MICROSCOPIC     Status: Abnormal   Collection Time   04/24/12 10:00 AM      Component Value Range   Color, Urine YELLOW  YELLOW   APPearance CLEAR  CLEAR   Specific Gravity, Urine 1.025  1.005 - 1.030   pH 6.0  5.0 - 8.0   Glucose, UA NEGATIVE  NEGATIVE mg/dL   Hgb urine dipstick NEGATIVE  NEGATIVE   Bilirubin Urine NEGATIVE  NEGATIVE   Ketones, ur NEGATIVE  NEGATIVE mg/dL   Protein, ur NEGATIVE  NEGATIVE mg/dL   Urobilinogen, UA 0.2  0.0 - 1.0 mg/dL   Nitrite NEGATIVE  NEGATIVE   Leukocytes, UA TRACE (*) NEGATIVE  URINE MICROSCOPIC-ADD ON     Status: Abnormal   Collection Time   04/24/12 10:00 AM      Component Value Range   Squamous Epithelial / LPF RARE  RARE   WBC, UA 7-10  <3 WBC/hpf   RBC / HPF 0-2  <3 RBC/hpf   Crystals CA OXALATE CRYSTALS (*) NEGATIVE  POCT PREGNANCY, URINE     Status: Abnormal   Collection Time   04/24/12 10:22 AM      Component Value Range   Preg Test, Ur POSITIVE (*) NEGATIVE  CBC     Status: Abnormal   Collection Time   04/24/12 11:00 AM      Component Value Range   WBC 7.0  4.0 - 10.5 K/uL   RBC 3.94  3.87 - 5.11 MIL/uL   Hemoglobin 11.6 (*) 12.0 - 15.0 g/dL   HCT 82.9 (*) 56.2 - 13.0 %   MCV 88.6  78.0 - 100.0 fL   MCH 29.4  26.0 - 34.0 pg   MCHC 33.2  30.0 - 36.0 g/dL   RDW 86.5  78.4 - 69.6 %   Platelets 296  150 - 400 K/uL  HCG, SERUM, QUALITATIVE     Status: Abnormal   Collection Time   04/24/12 11:00 AM      Component Value Range   Preg, Serum POSITIVE (*) NEGATIVE  WET PREP, GENITAL      Status: Abnormal   Collection Time   04/24/12 11:16 AM      Component Value Range   Yeast Wet Prep HPF POC NONE SEEN  NONE SEEN   Trich, Wet Prep NONE SEEN  NONE SEEN   Clue Cells Wet  Prep HPF POC NONE SEEN  NONE SEEN   WBC, Wet Prep HPF POC FEW (*) NONE SEEN     MAU Course  Procedures - CBC, US pelvic, b-HCG, UDS, GC/CHL, Wet prep - Patient's blood type already known, A positive - UPT positive and patient informed of results - Wet prep negative and patient updated - Proof of pregnancy letter requested by patient and provided to her at d/c  MDM - Given negative lab results and normal [place] Pt seemed fine after ultrasound and ready to go home  Assessment and Plan  1) Confirmation of pregnancy - US reveals EGA: [redacted]w[redacted]d by CRL. Patient to begin [facility] with Dr. Gaynell Face 2) Abdominal pain of pregnancy - patient may take Tylenol as needed for pain not to exceed 4g in 24 hours 3) Nausea - morning sickness associated with pregnancy. Rx given for both phenergan and zofran for patient to fill as needed.  Lewie Chamber 04/24/2012, 10:57 AM  This pt was seen along with Lewie Chamber, Med Student and agree with the above note.

## 2012-06-30 ENCOUNTER — Inpatient Hospital Stay (HOSPITAL_COMMUNITY)
Admission: AD | Admit: 2012-06-30 | Discharge: 2012-06-30 | Disposition: A | Discharge status: Home / Self Care | Payer: Self-pay | Source: Ambulatory Visit | Attending: Obstetrics & Gynecology | Admitting: Obstetrics & Gynecology

## 2012-06-30 ENCOUNTER — Encounter (HOSPITAL_COMMUNITY): Payer: Self-pay

## 2012-06-30 DIAGNOSIS — Z3490 Encounter for supervision of normal pregnancy, unspecified, unspecified trimester: Secondary | ICD-10-CM

## 2012-06-30 DIAGNOSIS — O99891 Other specified diseases and conditions complicating pregnancy: Secondary | ICD-10-CM

## 2012-06-30 DIAGNOSIS — W19XXXA Unspecified fall, initial encounter: Secondary | ICD-10-CM

## 2012-06-30 DIAGNOSIS — R109 Unspecified abdominal pain: Secondary | ICD-10-CM

## 2012-06-30 DIAGNOSIS — Y92009 Unspecified place in unspecified non-institutional (private) residence as the place of occurrence of the external cause: Secondary | ICD-10-CM | POA: Insufficient documentation

## 2012-06-30 DIAGNOSIS — W010XXA Fall on same level from slipping, tripping and stumbling without subsequent striking against object, initial encounter: Secondary | ICD-10-CM | POA: Insufficient documentation

## 2012-06-30 HISTORY — DX: Anxiety disorder, unspecified: F41.9

## 2012-06-30 LAB — URINALYSIS, ROUTINE W REFLEX MICROSCOPIC
Leukocytes, UA: NEGATIVE
Nitrite: NEGATIVE
Protein, ur: NEGATIVE mg/dL
Specific Gravity, Urine: 1.025 (ref 1.005–1.030)
Urobilinogen, UA: 0.2 mg/dL (ref 0.0–1.0)

## 2012-06-30 NOTE — MAU Note (Signed)
Pt states fell in shower this afternoon, hit abdomen only. Was cleaning tub and didn't fully replace bathmat in tub. Denies bleeding or vaginal discharge changes.

## 2012-06-30 NOTE — MAU Provider Note (Signed)
Medical Screening exam and patient care preformed by advanced practice provider.  Agree with the above management.  

## 2012-06-30 NOTE — MAU Provider Note (Signed)
  History     CSN: [id number]  Arrival date and time: 06/30/12 1638   None     Chief Complaint  Patient presents with  . Fall  . Abdominal Pain   HPI Regina Coleman is 26 y.o. [id number] [redacted]w[redacted]d weeks presenting with report of slipping in the tub about 1 hr ago.  Left side of her of abdomen hit the edge of the tub.  Said mat was not properly placed back after cleaning.  Denies vaginal bleeding or discharge.      Past Medical History  Diagnosis Date  . Genital herpes     Also Chlamydia, GC, HPV and Trich  . Mental disorder   . Bipolar 2 disorder     pt says r/o bipolar.  . ADHD (attention deficit hyperactivity disorder)   . Abnormal Pap smear     during preg, will retest after  . Abnormal Pap smear and cervical HPV (human papillomavirus)   . Anemia   . Depression   . Anxiety     Past Surgical History  Procedure Date  . No past surgeries     Family History  Problem Relation Age of Onset  . Diabetes Paternal Grandmother   . Heart disease Paternal Grandmother   . Hypertension Paternal Grandmother   . Other Neg Hx     History  Substance Use Topics  . Smoking status: Never Smoker   . Smokeless tobacco: Never Used  . Alcohol Use: No    Allergies: No Known Allergies  Prescriptions prior to admission  Medication Sig Dispense Refill  . Prenatal Vit-Fe Fumarate-FA (PRENATAL VITAMINS) 28-0.8 MG TABS Take 1 tablet by mouth 1 day or 1 dose.  30 tablet  3  . promethazine (PHENERGAN) 25 MG tablet Take 1 tablet (25 mg total) by mouth every 6 (six) hours as needed for nausea.  30 tablet  0    Review of Systems  Constitutional: Negative.   HENT: Negative.   Respiratory: Negative.   Cardiovascular: Negative.   Gastrointestinal: Positive for abdominal pain.  Genitourinary:       Negative for vaginal bleeding or discharge.  Cervix is high and closed.   Physical Exam   Blood pressure 108/74, pulse 94, temperature 98 F (36.7 C), temperature source Oral, resp. rate 20,  height 5\' 4"  (1.626 m), weight 81.466 kg (179 lb 9.6 oz), last menstrual period 01/28/2012, SpO2 100.00%, unknown if currently breastfeeding.  Physical Exam  Constitutional: She appears well-developed and well-nourished. No distress.  HENT:  Head: Normocephalic.  Neck: Normal range of motion.  Cardiovascular: Normal rate.   Respiratory: Effort normal.  GI: Soft. She exhibits no distension and no mass. There is no tenderness. There is no rebound and no guarding.       Tenderness on the left lateral side of abdomen  Genitourinary: Uterus is enlarged (17-18 week size). Uterus is not tender. Cervix exhibits no motion tenderness, no discharge and no friability. No bleeding around the vagina. No vaginal discharge found.    MAU Course  Procedures  MDM Bedside ultrasound--+cardiac movement and + fetal activity Assessment and Plan  A:  [redacted]w[redacted]d gestation with fall  P:  Fall precautions given     Continue prenatal care--has appt and anatomy scan per patient in 3 weeks     Return for vaginal bleeding or severe pain    Tylenol prn for discomfort  KEY,EVE M 06/30/2012, 5:29 PM

## 2012-06-30 NOTE — MAU Note (Signed)
Patient states she was in the tub taking a shower and tripped on the mat, fell and hit the left side of the abdomen. Having left side abdominal pain. Denies any bleeding or leaking.

## 2012-10-02 NOTE — L&D Delivery Note (Signed)
I have seen the patient with the resident/student and agree with the above.  Hogan, Heather Donovan  

## 2012-10-02 NOTE — L&D Delivery Note (Signed)
Delivery Note At 7:24 AM a viable and healthy female was delivered via Vaginal, Spontaneous Delivery (Presentation: Right Occiput Anterior).  APGAR: 7, 9; weight 6 lb 13.7 oz (3110 g).   Placenta status: Intact, Spontaneous Pathology.  Cord: 3 vessels with the following complications: Short.  Cord pH: Pending  Anesthesia: None  Episiotomy: None Lacerations: None Suture Repair: N/A Est. Blood Loss (mL): 250  Mom to postpartum.  Baby to nursery-stable.  Regina Coleman 11/26/2012, 8:24 AM    

## 2012-10-10 ENCOUNTER — Encounter (HOSPITAL_COMMUNITY): Payer: Self-pay | Admitting: *Deleted

## 2012-10-10 ENCOUNTER — Inpatient Hospital Stay (HOSPITAL_COMMUNITY)
Admission: AD | Admit: 2012-10-10 | Discharge: 2012-10-11 | Disposition: A | Discharge status: Home / Self Care | Payer: Medicaid Other | Source: Ambulatory Visit | Attending: Obstetrics & Gynecology | Admitting: Obstetrics & Gynecology

## 2012-10-10 DIAGNOSIS — O99320 Drug use complicating pregnancy, unspecified trimester: Secondary | ICD-10-CM

## 2012-10-10 DIAGNOSIS — O0930 Supervision of pregnancy with insufficient antenatal care, unspecified trimester: Secondary | ICD-10-CM | POA: Insufficient documentation

## 2012-10-10 DIAGNOSIS — O212 Late vomiting of pregnancy: Secondary | ICD-10-CM | POA: Insufficient documentation

## 2012-10-10 DIAGNOSIS — F1420 Cocaine dependence, uncomplicated: Secondary | ICD-10-CM | POA: Insufficient documentation

## 2012-10-10 DIAGNOSIS — O219 Vomiting of pregnancy, unspecified: Secondary | ICD-10-CM

## 2012-10-10 DIAGNOSIS — F1920 Other psychoactive substance dependence, uncomplicated: Secondary | ICD-10-CM | POA: Insufficient documentation

## 2012-10-10 NOTE — MAU Note (Signed)
PT SAYS SHE STARTED VOMITING 9PM.  BOYFRIEND GAVE HER CRACKERS / GINGER ALE- VOMITED AGAIN.   NO DIARRHEA.   CRAMPING  STARTED WHILE  VOMITING.   LAST SEX- BEFORE SHE VOMITED.    USED  CRACK ON WED-  SHE USES IT EVERY OTHER DAY.  NO PNC.     WAS  HERE IN OCT.

## 2012-10-10 NOTE — MAU Provider Note (Signed)
History     CSN: [id number]  Arrival date and time: 10/10/12 2310   None     No chief complaint on file.  HPI 27 y.o. [id number] at [redacted]w[redacted]d  with no PNC who presents due to nausea and vomiting since 9:00 PM today. She continues to feel good fetal movement, no bleeding, no discharge. Last intercourse was this evening. She has attempted home remedies of crackers and ginger ale, but immediately vomited. At the time of my exam, she was feeling nauseated, but was tolerating ginger ale.  She has not had prenatal care during this pregnancy and she admits to regular use of crack cocaine to RN.    Past Medical History  Diagnosis Date  . Genital herpes     Also Chlamydia, GC, HPV and Trich  . Mental disorder   . Bipolar 2 disorder     pt says r/o bipolar.  . ADHD (attention deficit hyperactivity disorder)   . Abnormal Pap smear     during preg, will retest after  . Abnormal Pap smear and cervical HPV (human papillomavirus)   . Anemia   . Depression   . Anxiety     Past Surgical History  Procedure Date  . No past surgeries     Family History  Problem Relation Age of Onset  . Diabetes Paternal Grandmother   . Heart disease Paternal Grandmother   . Hypertension Paternal Grandmother   . Other Neg Hx     History  Substance Use Topics  . Smoking status: Never Smoker   . Smokeless tobacco: Never Used  . Alcohol Use: No  Patient reports current cocaine usage to nurse, but specifically denies to me.  Allergies: No Known Allergies  Meds: PNV  Review of Systems  Constitutional: Negative for fever and chills.  HENT: Positive for congestion. Negative for sore throat.   Eyes: Negative for blurred vision and double vision.  Respiratory: Positive for cough. Negative for hemoptysis, sputum production and shortness of breath.   Cardiovascular: Negative for chest pain.  Gastrointestinal: Positive for nausea and vomiting. Negative for heartburn, abdominal pain, diarrhea and constipation.   Genitourinary: Negative for dysuria and hematuria.  Neurological: Negative for dizziness and headaches.   Physical Exam   Blood pressure 114/62, pulse 90, temperature 98.6 F (37 C), temperature source Oral, resp. rate 20, height 5\' 3"  (1.6 m), last menstrual period 01/28/2012, unknown if currently breastfeeding.  Physical Exam  Constitutional: She appears well-developed and well-nourished. No distress.  HENT:  Head: Normocephalic and atraumatic.  Mouth/Throat: Oropharynx is clear and moist.  Eyes: Conjunctivae normal and EOM are normal. Pupils are equal, round, and reactive to light.  Neck: Neck supple.  Cardiovascular: Normal rate, regular rhythm and intact distal pulses.   Murmur heard. Respiratory: Effort normal and breath sounds normal.  GI: Soft.       gravid  Musculoskeletal: She exhibits no edema and no tenderness.  Neurological: She is alert.  Skin: Skin is warm and dry.  Psychiatric:       Per nursing, Has not had prenatal care due to wanting to continue cocaine use  FHT 150, good variability, no decels. Category 1. No contractions on toco, none palpable Dilation: 1 Effacement (%): 50 Cervical Position: Posterior Station: Ballotable Presentation: Vertex Exam by:: Joesph Marcy, CNM  Cervix unchanged in 2 hours in MAU, no Ctx on monitor or palpable  Results for orders placed during the hospital encounter of 10/10/12 (from the past 24 hour(s))  URINALYSIS, ROUTINE W REFLEX MICROSCOPIC  Status: Abnormal   Collection Time   10/11/12  1:25 AM      Component Value Range   Color, Urine YELLOW  YELLOW   APPearance CLEAR  CLEAR   Specific Gravity, Urine >1.030 (*) 1.005 - 1.030   pH 5.5  5.0 - 8.0   Glucose, UA NEGATIVE  NEGATIVE mg/dL   Hgb urine dipstick NEGATIVE  NEGATIVE   Bilirubin Urine NEGATIVE  NEGATIVE   Ketones, ur >80 (*) NEGATIVE mg/dL   Protein, ur NEGATIVE  NEGATIVE mg/dL   Urobilinogen, UA 0.2  0.0 - 1.0 mg/dL   Nitrite NEGATIVE  NEGATIVE   Leukocytes,  UA NEGATIVE  NEGATIVE  URINE RAPID DRUG SCREEN (HOSP PERFORMED)     Status: Abnormal   Collection Time   10/11/12  1:25 AM      Component Value Range   Opiates NONE DETECTED  NONE DETECTED   Cocaine POSITIVE (*) NONE DETECTED   Benzodiazepines NONE DETECTED  NONE DETECTED   Amphetamines NONE DETECTED  NONE DETECTED   Tetrahydrocannabinol NONE DETECTED  NONE DETECTED   Barbiturates NONE DETECTED  NONE DETECTED   MAU Course  Procedures   Assessment and Plan   1. Nausea/vomiting in pregnancy   2. Drug dependence complicating pregnancy     Plan:  Zofran 8 mg ODT x1 Pt vomiting PO fluids after Zofran dose LR x1000 ml in MAU Phenergan 25 mg IV x1 dose--pt sleeping 30 minutes after this dose without complaint D/C home with PTL precautions Recommend prenatal care as soon as possible at [facility]--pt given contact information Return to MAU as needed  CONROY, LOUISA 10/10/2012, 11:55 PM

## 2012-10-10 NOTE — MAU Note (Signed)
HAS ARRIVED [facility]

## 2012-10-11 DIAGNOSIS — O219 Vomiting of pregnancy, unspecified: Secondary | ICD-10-CM

## 2012-10-11 DIAGNOSIS — F1920 Other psychoactive substance dependence, uncomplicated: Secondary | ICD-10-CM

## 2012-10-11 DIAGNOSIS — O99320 Drug use complicating pregnancy, unspecified trimester: Secondary | ICD-10-CM | POA: Diagnosis present

## 2012-10-11 LAB — URINALYSIS, ROUTINE W REFLEX MICROSCOPIC
Ketones, ur: >80 mg/dL — AB
Leukocytes, UA: NEGATIVE
Nitrite: NEGATIVE
Protein, ur: NEGATIVE mg/dL
Urobilinogen, UA: 0.2 mg/dL (ref 0.0–1.0)

## 2012-10-11 LAB — RAPID URINE DRUG SCREEN, HOSP PERFORMED: Amphetamines: NOT DETECTED

## 2012-10-11 MED ORDER — LACTATED RINGERS IV BOLUS (SEPSIS)
1000.0000 mL | Freq: Once | INTRAVENOUS | Status: AC
  Administered 2012-10-11: 1000 mL via INTRAVENOUS

## 2012-10-11 MED ORDER — ONDANSETRON 8 MG PO TBDP
8.0000 mg | ORAL_TABLET | Freq: Once | ORAL | Status: AC
  Administered 2012-10-11: 8 mg via ORAL
  Filled 2012-10-11: qty 1

## 2012-10-11 MED ORDER — PROMETHAZINE HCL 25 MG/ML IJ SOLN
25.0000 mg | Freq: Once | INTRAMUSCULAR | Status: AC
  Administered 2012-10-11: 25 mg via INTRAVENOUS
  Filled 2012-10-11: qty 1

## 2012-10-11 MED ORDER — PROMETHAZINE HCL 25 MG RE SUPP: 25.0000 mg | Freq: Four times a day (QID) | RECTAL | Status: DC | PRN

## 2012-10-11 NOTE — MAU Note (Signed)
DRANK CUP ICE WATER- NO N/V.

## 2012-10-11 NOTE — MAU Note (Signed)
PO SPRITE 

## 2012-10-19 NOTE — MAU Provider Note (Signed)
Attestation of Attending Supervision of Advanced Practitioner (CNM/NP): Evaluation and management procedures were performed by the Advanced Practitioner under my supervision and collaboration. I have reviewed the Advanced Practitioner's note and chart, and I agree with the management and plan.  LEGGETT,KELLY H. 12:00 PM   

## 2012-11-09 ENCOUNTER — Encounter (HOSPITAL_COMMUNITY): Payer: Self-pay | Admitting: *Deleted

## 2012-11-09 ENCOUNTER — Inpatient Hospital Stay (HOSPITAL_COMMUNITY)
Admission: AD | Admit: 2012-11-09 | Discharge: 2012-11-09 | Payer: Medicaid Other | Source: Ambulatory Visit | Attending: Obstetrics & Gynecology | Admitting: Obstetrics & Gynecology

## 2012-11-09 DIAGNOSIS — Z008 Encounter for other general examination: Secondary | ICD-10-CM

## 2012-11-09 DIAGNOSIS — O99340 Other mental disorders complicating pregnancy, unspecified trimester: Secondary | ICD-10-CM | POA: Insufficient documentation

## 2012-11-09 DIAGNOSIS — O0930 Supervision of pregnancy with insufficient antenatal care, unspecified trimester: Secondary | ICD-10-CM

## 2012-11-09 DIAGNOSIS — F1410 Cocaine abuse, uncomplicated: Secondary | ICD-10-CM | POA: Insufficient documentation

## 2012-11-09 LAB — DIFFERENTIAL
Basophils Absolute: 0 10*3/uL (ref 0.0–0.1)
Lymphocytes Relative: 23 % (ref 12–46)
Lymphs Abs: 2 10*3/uL (ref 0.7–4.0)
Monocytes Absolute: 1 10*3/uL (ref 0.1–1.0)
Neutro Abs: 5.6 10*3/uL (ref 1.7–7.7)

## 2012-11-09 LAB — CBC
HCT: 27.5 % — ABNORMAL LOW (ref 36.0–46.0)
MCV: 87.3 fL (ref 78.0–100.0)
RBC: 3.15 MIL/uL — ABNORMAL LOW (ref 3.87–5.11)
RDW: 12.8 % (ref 11.5–15.5)
WBC: 8.7 10*3/uL (ref 4.0–10.5)

## 2012-11-09 LAB — TYPE AND SCREEN: ABO/RH(D): A POS

## 2012-11-09 NOTE — MAU Provider Note (Signed)
History     CSN: [id number]  Arrival date and time: 11/09/12 2047   None     Chief Complaint  Patient presents with  . Labor Eval   HPI Regina Coleman is a 27 y.o. [id number] female at [redacted]w[redacted]d by 8.0wk u/s who presents in custody of [facility] [employer] for medical clearance prior to incarceration.  Pt admits to using cocaine approximately 3 hours ago.  Denies pain, uc's, vb, lof, abnormal d/c, or other complaints.  Reports good fm.  No prenatal care to date, has had 3 prior mau visits during this pregnancy.  States she wants to initiate care at [facility], and is currently taking a daily pnv.   OB History   Grav Para Term Preterm Abortions TAB SAB Ect Mult Living   10 7 7  0 2 0 2 0 0 7      Past Medical History  Diagnosis Date  . Genital herpes     Also Chlamydia, GC, HPV and Trich  . Mental disorder   . Bipolar 2 disorder     pt says r/o bipolar.  . ADHD (attention deficit hyperactivity disorder)   . Abnormal Pap smear     during preg, will retest after  . Abnormal Pap smear and cervical HPV (human papillomavirus)   . Anemia   . Depression   . Anxiety     Past Surgical History  Procedure Laterality Date  . No past surgeries      Family History  Problem Relation Age of Onset  . Diabetes Paternal Grandmother   . Heart disease Paternal Grandmother   . Hypertension Paternal Grandmother   . Other Neg Hx     History  Substance Use Topics  . Smoking status: Never Smoker   . Smokeless tobacco: Never Used  . Alcohol Use: No    Allergies: No Known Allergies  Prescriptions prior to admission  Medication Sig Dispense Refill  . Prenatal Vit-Fe Fumarate-FA (PRENATAL VITAMINS) 28-0.8 MG TABS Take 1 tablet by mouth 1 day or 1 dose.  30 tablet  3  . promethazine (PHENERGAN) 25 MG suppository Place 1 suppository (25 mg total) rectally every 6 (six) hours as needed for nausea.  12 each  0    Review of Systems  Constitutional: Negative.   HENT: Negative.   Eyes: Negative.    Respiratory: Negative.   Cardiovascular: Negative.   Gastrointestinal: Negative.  Negative for abdominal pain.  Genitourinary: Negative.   Musculoskeletal: Negative.   Skin: Negative.   Neurological: Negative.   Endo/Heme/Allergies: Negative.   Psychiatric/Behavioral: Positive for substance abuse (cocaine).   Physical Exam   Blood pressure 85/47, pulse 91, resp. rate 20, height 5\' 3"  (1.6 m), weight 83.915 kg (185 lb), last menstrual period 01/28/2012, SpO2 100.00%.  Physical Exam  Constitutional: She appears well-developed and well-nourished.  HENT:  Head: Normocephalic.  Neck: Normal range of motion.  Cardiovascular: Normal rate and regular rhythm.   Respiratory: Effort normal and breath sounds normal.  GI: Soft. There is no tenderness.  gravid  Genitourinary:  Deferred- no report of uc's, pain, lof, vb, abnormal d/c  Musculoskeletal: Normal range of motion.  Neurological:  Pt somnolent, falls asleep between questions  Skin: Skin is warm and dry.  Psychiatric:  Very jumpy when awakes to answer questions, falls back asleep when not speaking/being spoken to   FHR: 130, mod variability, 15x15accels, no decels=Cat I, reactive UCs: none  MAU Course  Procedures  NST OB panel UDS  Assessment and Plan  A:  [redacted]w[redacted]d [facility]  Substance abuse- cocaine, complicating third trimester pregnancy  Medical clearance prior to incarceration  Cat I FHR  No prenatal care  Grand multipara   P:  D/C to custody of [place] pd  Call [facility] asap to schedule appointment to initiate pnc  Continue pnv   Marge Duncans 11/09/2012, 9:09 PM

## 2012-11-09 NOTE — MAU Note (Signed)
Pt discharged in custody of the police

## 2012-11-09 NOTE — MAU Note (Signed)
Pt responds appropriatly to questions and is co-operative-she is very restless and moves side to side in the bed-states she used crack/cocaine 3 hours ago-a police officer is outside the pt's door

## 2012-11-09 NOTE — MAU Note (Signed)
Pt received to rm#8-under custody of [place] is for medical clearance before going to jail

## 2012-11-09 NOTE — MAU Provider Note (Signed)
Attestation of Attending Supervision of Advanced Practitioner (CNM/NP): Evaluation and management procedures were performed by the Advanced Practitioner under my supervision and collaboration.  I have reviewed the Advanced Practitioner's note and chart, and I agree with the management and plan.  HARRAWAY-SMITH, Starsha Morning 10:31 PM     

## 2012-11-10 LAB — RPR: RPR Ser Ql: NONREACTIVE

## 2012-11-10 LAB — HIV ANTIBODY (ROUTINE TESTING W REFLEX): HIV: NONREACTIVE

## 2012-11-24 ENCOUNTER — Encounter (HOSPITAL_COMMUNITY): Payer: Self-pay

## 2012-11-24 ENCOUNTER — Inpatient Hospital Stay (HOSPITAL_COMMUNITY)
Admission: AD | Admit: 2012-11-24 | Discharge: 2012-11-24 | Disposition: A | Discharge status: Home / Self Care | Payer: Medicaid Other | Source: Ambulatory Visit | Attending: Family Medicine | Admitting: Family Medicine

## 2012-11-24 DIAGNOSIS — O99340 Other mental disorders complicating pregnancy, unspecified trimester: Secondary | ICD-10-CM | POA: Insufficient documentation

## 2012-11-24 DIAGNOSIS — Z5900 Homelessness unspecified: Secondary | ICD-10-CM | POA: Insufficient documentation

## 2012-11-24 DIAGNOSIS — O99019 Anemia complicating pregnancy, unspecified trimester: Secondary | ICD-10-CM | POA: Insufficient documentation

## 2012-11-24 DIAGNOSIS — F1920 Other psychoactive substance dependence, uncomplicated: Secondary | ICD-10-CM

## 2012-11-24 DIAGNOSIS — O99320 Drug use complicating pregnancy, unspecified trimester: Secondary | ICD-10-CM

## 2012-11-24 DIAGNOSIS — O479 False labor, unspecified: Secondary | ICD-10-CM | POA: Insufficient documentation

## 2012-11-24 DIAGNOSIS — F1410 Cocaine abuse, uncomplicated: Secondary | ICD-10-CM | POA: Insufficient documentation

## 2012-11-24 DIAGNOSIS — O0930 Supervision of pregnancy with insufficient antenatal care, unspecified trimester: Secondary | ICD-10-CM | POA: Insufficient documentation

## 2012-11-24 DIAGNOSIS — O471 False labor at or after 37 completed weeks of gestation: Secondary | ICD-10-CM

## 2012-11-24 DIAGNOSIS — D649 Anemia, unspecified: Secondary | ICD-10-CM | POA: Insufficient documentation

## 2012-11-24 LAB — RAPID URINE DRUG SCREEN, HOSP PERFORMED
Amphetamines: NOT DETECTED
Barbiturates: NOT DETECTED
Cocaine: POSITIVE — AB
Opiates: NOT DETECTED
Tetrahydrocannabinol: NOT DETECTED

## 2012-11-24 LAB — WET PREP, GENITAL

## 2012-11-24 MED ORDER — FERROUS SULFATE 325 (65 FE) MG PO TBEC: 325.0000 mg | DELAYED_RELEASE_TABLET | Freq: Three times a day (TID) | ORAL | Status: DC

## 2012-11-24 NOTE — MAU Note (Signed)
Contractions this morning but doesn't know when started or how frequent. Leaking fluid off and on since last night. Denies vaginal bleeding. Swollen vagina x 2 days, denies irritation or itching. Treated for trich last month but then had intercourse with same person who didn't get treated.

## 2012-11-24 NOTE — MAU Provider Note (Signed)
Chief Complaint:  Labor Eval contractions and vaginal discharge    HPI: Regina Coleman is a 27 y.o. [id number] with no PNC at [redacted]w[redacted]d by 8 week sono who presents to maternity admissions reporting irregular contractions and vaginal discharge. States she is concerned she may have trich as she had it previously and had intercourse with her boyfriend without knowing if he was treated.   Denies vaginal bleeding and leakage of fluid  Pregnancy Course:  Has had no PNC except for  previous MAU visits. Was last in MAU for medical clearance prior to incarceration on 11/09/12 and had OB panel done. Admits using cocaine as recently as three days ago. Reports homelessness.   Past Medical History: Past Medical History  Diagnosis Date  . Genital herpes     Also Chlamydia, GC, HPV and Trich  . Mental disorder   . Bipolar 2 disorder     pt says r/o bipolar.  . ADHD (attention deficit hyperactivity disorder)   . Abnormal Pap smear     during preg, will retest after  . Abnormal Pap smear and cervical HPV (human papillomavirus)   . Anemia   . Depression   . Anxiety     Past obstetric history: OB History   Grav Para Term Preterm Abortions TAB SAB Ect Mult Living   10 7 7  0 2 0 2 0 0 7     # Outc Date GA Lbr Len/2nd Wgt Sex Del Anes PTL Lv   1 SAB 2004 [redacted]w[redacted]d          Comments: SAB   2 TRM 2006 [redacted]w[redacted]d    SVD  No    3 TRM 2007 [redacted]w[redacted]d    SVD  No    4 TRM 2008 [redacted]w[redacted]d    SVD      5 TRM 2008 [redacted]w[redacted]d    SVD      6 TRM 2009 [redacted]w[redacted]d    SVD  Yes    7 TRM 2011 [redacted]w[redacted]d    SVD      Comments: No Legal Custody of any of her children   8 TRM 7/12 [redacted]w[redacted]d 14:40 / 00:18 4.08kg(8lb15.9oz) F SVD EPI  Yes   9 SAB            10 CUR               Past Surgical History: Past Surgical History  Procedure Laterality Date  . No past surgeries      Family History: Family History  Problem Relation Age of Onset  . Diabetes Paternal Grandmother   . Heart disease Paternal Grandmother   . Hypertension Paternal Grandmother   .  Other Neg Hx     Social History: History  Substance Use Topics  . Smoking status: Never Smoker   . Smokeless tobacco: Never Used  . Alcohol Use: No    Allergies: No Known Allergies  Meds:  Prescriptions prior to admission  Medication Sig Dispense Refill  . Prenatal Vit-Fe Fumarate-FA (PRENATAL VITAMINS) 28-0.8 MG TABS Take 1 tablet by mouth 1 day or 1 dose.  30 tablet  3  . promethazine (PHENERGAN) 25 MG suppository Place 1 suppository (25 mg total) rectally every 6 (six) hours as needed for nausea.  12 each  0    ROS: Pertinent findings in history of present illness.  Physical Exam  Blood pressure 115/68, pulse 74, temperature 98.2 F (36.8 C), temperature source Oral, resp. rate 18, height 5\' 4"  (1.626 m), weight 79.561 kg (175  lb 6.4 oz), last menstrual period 01/28/2012, unknown if currently breastfeeding. GENERAL: Well-developed, well-nourished female in no acute distress.  HEENT: normocephalic HEART: normal rate RESP: normal effort ABDOMEN: Soft, non-tender, gravid appropriate for gestational age EXTREMITIES: Nontender, no edema NEURO: alert and oriented SPECULUM EXAM: NEFG, physiologic discharge, no blood, cervix clean  Psych: Pt falls asleep easily and is difficult to arouse. When awake, appears confused and jumpy.   FHT:  Baseline 135 , moderate variability, accelerations present, no decelerations Contractions: irregular   Labs: Results for orders placed during the hospital encounter of 11/24/12 (from the past 24 hour(s))  WET PREP, GENITAL     Status: Abnormal   Collection Time    11/24/12  6:28 AM      Result Value Range   Yeast Wet Prep HPF POC RARE (*) NONE SEEN   Trich, Wet Prep NONE SEEN  NONE SEEN   Clue Cells Wet Prep HPF POC NONE SEEN  NONE SEEN   WBC, Wet Prep HPF POC MODERATE (*) NONE SEEN     ED Course UDS, wet prep, gc/ct, GBS done. NST done.   Assessment:  1. [id number] at [redacted]w[redacted]d  2. Cocaine abuse 3. No prenatal care outside of MAU 4.  Anemia- 9&27 on 11/09/12 labs  Plan: Discharge home Labor precautions and fetal kick counts Outpatient anatomy scan scheduled-pt verbalizes she will be able to get transportation to this appointment. Encouraged to make appointment to initiate [facility] Ferrous sulfate prescription given    Medication List    TAKE these medications       ferrous sulfate 325 (65 FE) MG EC tablet  Take 1 tablet (325 mg total) by mouth 3 (three) times daily with meals.     Prenatal Vitamins 28-0.8 MG Tabs  Take 1 tablet by mouth 1 day or 1 dose.     promethazine 25 MG suppository  Commonly known as:  PHENERGAN  Place 1 suppository (25 mg total) rectally every 6 (six) hours as needed for nausea.        Wilber Oliphant, student midwife  11/24/2012 6:40 AM  Seen and agree with note Wynelle Bourgeois CNM

## 2012-11-26 ENCOUNTER — Encounter (HOSPITAL_COMMUNITY): Payer: Self-pay | Admitting: *Deleted

## 2012-11-26 ENCOUNTER — Ambulatory Visit (HOSPITAL_COMMUNITY): Admit: 2012-11-26 | Payer: Self-pay

## 2012-11-26 ENCOUNTER — Inpatient Hospital Stay (HOSPITAL_COMMUNITY)
Admission: AD | Admit: 2012-11-26 | Discharge: 2012-11-28 | DRG: 774 | Disposition: A | Discharge status: Home / Self Care | Payer: Medicaid Other | Source: Ambulatory Visit | Attending: Obstetrics & Gynecology | Admitting: Obstetrics & Gynecology

## 2012-11-26 DIAGNOSIS — F1420 Cocaine dependence, uncomplicated: Secondary | ICD-10-CM | POA: Diagnosis present

## 2012-11-26 DIAGNOSIS — F1920 Other psychoactive substance dependence, uncomplicated: Secondary | ICD-10-CM

## 2012-11-26 DIAGNOSIS — O9989 Other specified diseases and conditions complicating pregnancy, childbirth and the puerperium: Secondary | ICD-10-CM

## 2012-11-26 DIAGNOSIS — O99892 Other specified diseases and conditions complicating childbirth: Secondary | ICD-10-CM | POA: Diagnosis present

## 2012-11-26 DIAGNOSIS — O0930 Supervision of pregnancy with insufficient antenatal care, unspecified trimester: Secondary | ICD-10-CM

## 2012-11-26 DIAGNOSIS — Z22330 Carrier of Group B streptococcus: Secondary | ICD-10-CM

## 2012-11-26 DIAGNOSIS — O99324 Drug use complicating childbirth: Secondary | ICD-10-CM

## 2012-11-26 LAB — CBC
HCT: 33.9 % — ABNORMAL LOW (ref 36.0–46.0)
HCT: 31.1 % — ABNORMAL LOW (ref 36.0–46.0)
MCH: 29.6 pg (ref 26.0–34.0)
MCH: 29.9 pg (ref 26.0–34.0)
MCHC: 33.9 g/dL (ref 30.0–36.0)
MCV: 87.1 fL (ref 78.0–100.0)
MCV: 87.6 fL (ref 78.0–100.0)
Platelets: 265 10*3/uL (ref 150–400)
RDW: 13.1 % (ref 11.5–15.5)
RDW: 13 % (ref 11.5–15.5)
WBC: 12.4 10*3/uL — ABNORMAL HIGH (ref 4.0–10.5)
WBC: 14.6 10*3/uL — ABNORMAL HIGH (ref 4.0–10.5)

## 2012-11-26 LAB — RAPID URINE DRUG SCREEN, HOSP PERFORMED
Benzodiazepines: NOT DETECTED
Cocaine: POSITIVE — AB
Opiates: NOT DETECTED

## 2012-11-26 LAB — TYPE AND SCREEN
ABO/RH(D): A POS
Antibody Screen: NEGATIVE

## 2012-11-26 MED ORDER — IBUPROFEN 600 MG PO TABS: 600.0000 mg | ORAL_TABLET | Freq: Four times a day (QID) | ORAL | Status: DC | PRN

## 2012-11-26 MED ORDER — OXYTOCIN 40 UNITS IN LACTATED RINGERS INFUSION - SIMPLE MED
62.5000 mL/h | INTRAVENOUS | Status: DC
  Administered 2012-11-26: 999 mL/h via INTRAVENOUS
  Filled 2012-11-26: qty 1000

## 2012-11-26 MED ORDER — OXYCODONE-ACETAMINOPHEN 5-325 MG PO TABS
1.0000 | ORAL_TABLET | ORAL | Status: DC | PRN
  Administered 2012-11-26 – 2012-11-28 (×6): 1 via ORAL
  Filled 2012-11-26 (×6): qty 1

## 2012-11-26 MED ORDER — TETANUS-DIPHTH-ACELL PERTUSSIS 5-2.5-18.5 LF-MCG/0.5 IM SUSP: 0.5000 mL | Freq: Once | INTRAMUSCULAR | Status: DC

## 2012-11-26 MED ORDER — FLEET ENEMA 7-19 GM/118ML RE ENEM: 1.0000 | ENEMA | RECTAL | Status: DC | PRN

## 2012-11-26 MED ORDER — ONDANSETRON HCL 4 MG/2ML IJ SOLN
4.0000 mg | INTRAMUSCULAR | Status: DC | PRN
  Administered 2012-11-26: 4 mg via INTRAVENOUS
  Filled 2012-11-26: qty 2

## 2012-11-26 MED ORDER — NALBUPHINE SYRINGE 5 MG/0.5 ML
5.0000 mg | INJECTION | INTRAMUSCULAR | Status: DC | PRN
  Filled 2012-11-26 (×2): qty 0.5

## 2012-11-26 MED ORDER — METHYLERGONOVINE MALEATE 0.2 MG PO TABS: 0.2000 mg | ORAL_TABLET | ORAL | Status: DC | PRN

## 2012-11-26 MED ORDER — CITRIC ACID-SODIUM CITRATE 334-500 MG/5ML PO SOLN: 30.0000 mL | ORAL | Status: DC | PRN

## 2012-11-26 MED ORDER — ONDANSETRON HCL 4 MG/2ML IJ SOLN: 4.0000 mg | Freq: Four times a day (QID) | INTRAMUSCULAR | Status: DC | PRN

## 2012-11-26 MED ORDER — LACTATED RINGERS IV SOLN: INTRAVENOUS | Status: DC

## 2012-11-26 MED ORDER — METHYLERGONOVINE MALEATE 0.2 MG PO TABS
0.2000 mg | ORAL_TABLET | ORAL | Status: DC | PRN
  Administered 2012-11-26 (×3): 0.2 mg via ORAL
  Filled 2012-11-26 (×3): qty 1

## 2012-11-26 MED ORDER — LACTATED RINGERS IV SOLN
500.0000 mL | Freq: Once | INTRAVENOUS | Status: AC
  Administered 2012-11-26: 500 mL via INTRAVENOUS

## 2012-11-26 MED ORDER — OXYTOCIN BOLUS FROM INFUSION: 500.0000 mL | INTRAVENOUS | Status: DC

## 2012-11-26 MED ORDER — DIBUCAINE 1 % RE OINT
1.0000 "application " | TOPICAL_OINTMENT | RECTAL | Status: DC | PRN
  Filled 2012-11-26: qty 28

## 2012-11-26 MED ORDER — LACTATED RINGERS IV SOLN: 500.0000 mL | INTRAVENOUS | Status: DC | PRN

## 2012-11-26 MED ORDER — PHENYLEPHRINE 40 MCG/ML (10ML) SYRINGE FOR IV PUSH (FOR BLOOD PRESSURE SUPPORT): 80.0000 ug | PREFILLED_SYRINGE | INTRAVENOUS | Status: DC | PRN

## 2012-11-26 MED ORDER — EPHEDRINE 5 MG/ML INJ: 10.0000 mg | INTRAVENOUS | Status: DC | PRN

## 2012-11-26 MED ORDER — SODIUM CHLORIDE 0.9 % IV SOLN
2.0000 g | Freq: Once | INTRAVENOUS | Status: AC
  Administered 2012-11-26: 2 g via INTRAVENOUS
  Filled 2012-11-26: qty 2000

## 2012-11-26 MED ORDER — SIMETHICONE 80 MG PO CHEW: 80.0000 mg | CHEWABLE_TABLET | ORAL | Status: DC | PRN

## 2012-11-26 MED ORDER — LIDOCAINE HCL (PF) 1 % IJ SOLN
30.0000 mL | INTRAMUSCULAR | Status: DC | PRN
  Filled 2012-11-26: qty 30

## 2012-11-26 MED ORDER — IBUPROFEN 600 MG PO TABS
600.0000 mg | ORAL_TABLET | Freq: Four times a day (QID) | ORAL | Status: DC
  Administered 2012-11-26 – 2012-11-28 (×10): 600 mg via ORAL
  Filled 2012-11-26 (×10): qty 1

## 2012-11-26 MED ORDER — METHYLERGONOVINE MALEATE 0.2 MG/ML IJ SOLN
INTRAMUSCULAR | Status: AC
  Administered 2012-11-26: 0.2 mg via INTRAMUSCULAR
  Filled 2012-11-26: qty 1

## 2012-11-26 MED ORDER — DIPHENHYDRAMINE HCL 25 MG PO CAPS: 25.0000 mg | ORAL_CAPSULE | Freq: Four times a day (QID) | ORAL | Status: DC | PRN

## 2012-11-26 MED ORDER — FENTANYL 2.5 MCG/ML BUPIVACAINE 1/10 % EPIDURAL INFUSION (WH - ANES)
14.0000 mL/h | INTRAMUSCULAR | Status: DC
  Filled 2012-11-26: qty 125

## 2012-11-26 MED ORDER — FERROUS SULFATE 325 (65 FE) MG PO TBEC
325.0000 mg | DELAYED_RELEASE_TABLET | Freq: Three times a day (TID) | ORAL | Status: DC
  Administered 2012-11-26: 325 mg via ORAL
  Filled 2012-11-26 (×5): qty 1

## 2012-11-26 MED ORDER — ZOLPIDEM TARTRATE 5 MG PO TABS: 5.0000 mg | ORAL_TABLET | Freq: Every evening | ORAL | Status: DC | PRN

## 2012-11-26 MED ORDER — PRENATAL MULTIVITAMIN CH
1.0000 | ORAL_TABLET | Freq: Every day | ORAL | Status: DC
  Administered 2012-11-26 – 2012-11-28 (×3): 1 via ORAL
  Filled 2012-11-26 (×3): qty 1

## 2012-11-26 MED ORDER — LANOLIN HYDROUS EX OINT: TOPICAL_OINTMENT | CUTANEOUS | Status: DC | PRN

## 2012-11-26 MED ORDER — WITCH HAZEL-GLYCERIN EX PADS: 1.0000 "application " | MEDICATED_PAD | CUTANEOUS | Status: DC | PRN

## 2012-11-26 MED ORDER — BENZOCAINE-MENTHOL 20-0.5 % EX AERO
1.0000 "application " | INHALATION_SPRAY | CUTANEOUS | Status: DC | PRN
  Filled 2012-11-26 (×2): qty 56

## 2012-11-26 MED ORDER — MORPHINE SULFATE 4 MG/ML IJ SOLN
INTRAMUSCULAR | Status: AC
  Administered 2012-11-26: 2 mg via INTRAVENOUS
  Filled 2012-11-26: qty 1

## 2012-11-26 MED ORDER — OXYCODONE-ACETAMINOPHEN 5-325 MG PO TABS: 1.0000 | ORAL_TABLET | ORAL | Status: DC | PRN

## 2012-11-26 MED ORDER — EPHEDRINE 5 MG/ML INJ
10.0000 mg | INTRAVENOUS | Status: DC | PRN
  Filled 2012-11-26: qty 4

## 2012-11-26 MED ORDER — DIPHENHYDRAMINE HCL 50 MG/ML IJ SOLN: 12.5000 mg | INTRAMUSCULAR | Status: DC | PRN

## 2012-11-26 MED ORDER — ACETAMINOPHEN 325 MG PO TABS: 650.0000 mg | ORAL_TABLET | ORAL | Status: DC | PRN

## 2012-11-26 MED ORDER — METHYLERGONOVINE MALEATE 0.2 MG/ML IJ SOLN: 0.2000 mg | Freq: Once | INTRAMUSCULAR | Status: AC

## 2012-11-26 MED ORDER — PHENYLEPHRINE 40 MCG/ML (10ML) SYRINGE FOR IV PUSH (FOR BLOOD PRESSURE SUPPORT)
80.0000 ug | PREFILLED_SYRINGE | INTRAVENOUS | Status: DC | PRN
  Filled 2012-11-26: qty 5

## 2012-11-26 MED ORDER — INFLUENZA VIRUS VACC SPLIT PF IM SUSP: 0.5000 mL | INTRAMUSCULAR | Status: AC

## 2012-11-26 MED ORDER — NALBUPHINE SYRINGE 5 MG/0.5 ML
10.0000 mg | INJECTION | Freq: Once | INTRAMUSCULAR | Status: AC
  Administered 2012-11-26: 10 mg via INTRAVENOUS

## 2012-11-26 MED ORDER — SENNOSIDES-DOCUSATE SODIUM 8.6-50 MG PO TABS
2.0000 | ORAL_TABLET | Freq: Every day | ORAL | Status: DC
  Administered 2012-11-26 – 2012-11-27 (×2): 2 via ORAL

## 2012-11-26 MED ORDER — ONDANSETRON HCL 4 MG PO TABS: 4.0000 mg | ORAL_TABLET | ORAL | Status: DC | PRN

## 2012-11-26 MED ORDER — OXYTOCIN 40 UNITS IN LACTATED RINGERS INFUSION - SIMPLE MED
INTRAVENOUS | Status: AC
  Filled 2012-11-26: qty 1000

## 2012-11-26 NOTE — MAU Provider Note (Signed)
Chart reviewed and agree with management and plan.  

## 2012-11-26 NOTE — Progress Notes (Signed)
Patient ID: Regina Coleman, female   DOB: March 27, 1986, 27 y.o.   MRN: [id number]  S:  Called to room for pt bleeding - has soaked one large pad in less than 30 min. Passed 2 fist-sized clots earlier and more in toilet.  Did get one dose of methergine IM.  Pt denies pain, a little lightheaded.  O:   BP:  120/80, pulse 71  Pt alert and oriented. Uncooperative with exam, refusing vaginal exam, states very sore.   A/P 27 y.o. [id number] s/p SVD this morning with heavy bleeding. Estimate 500-700 out since delivery. Pt finally permitted vaginal exam after 2 mg IV morphine. However, began hitting RN during exam and yelled for me to stop. Unable to get good full bimanual exam but did get additional clots from lower segment.  Will give methergine PO per [facility] protocol.  Check CBC tonight and in AM.  Napoleon Form, MD

## 2012-11-26 NOTE — Progress Notes (Signed)
Regina Coleman is a 27 y.o. [id number] at [redacted]w[redacted]d   Subjective:  Feeling increased pressure. Wants an epidural, anesthesia is in another room at this time.   Objective: BP 136/80  Pulse 78  Temp(Src) 97.2 F (36.2 C) (Oral)  Resp 20  Ht 5\' 3"  (1.6 m)  Wt 79.379 kg (175 lb)  BMI 31.01 kg/m2  LMP 01/28/2012      FHT:  FHR: 140 bpm, variability: moderate,  accelerations:  Present,  decelerations:  Absent UC:   regular, every 2-3 minutes SVE:  6-7/100/-1/BBOW Labs: Lab Results  Component Value Date   WBC 12.4* 11/26/2012   HGB 11.5* 11/26/2012   HCT 33.9* 11/26/2012   MCV 87.1 11/26/2012   PLT 311 11/26/2012    Assessment / Plan: Spontaneous labor, progressing normally Pt refusing AROM at this time.   Labor: Progressing normally Preeclampsia:  NA Fetal Wellbeing:  Category I Pain Control:  Nubain Epidural ASAP I/D:  n/a Anticipated MOD:  NSVD  Tawnya Crook 11/26/2012, 6:58 AM

## 2012-11-26 NOTE — H&P (Signed)
Regina Coleman is a 27 y.o. female [phone] presenting for contractions. She states that her contractions started 1 hour ago and are very severe. She has had some spotting after being checked just now in the MAU. SHe denies vaginal discharge. She is unsure as to wether she has had any loss of fluid or not. She has not gotten prenatal care anywhere and admits to using crack cocaine during pregnancy. She denies other drug use, ethanal use, or cigarette smoking during pregnancy. She last ate between 8 and 10 pm last night.  History OB History   Grav Para Term Preterm Abortions TAB SAB Ect Mult Living   10 7 7  0 2 0 2 0 0 7     Past Medical History  Diagnosis Date  . Genital herpes     Also Chlamydia, GC, HPV and Trich  . Mental disorder   . Bipolar 2 disorder     pt says r/o bipolar.  . ADHD (attention deficit hyperactivity disorder)   . Abnormal Pap smear     during preg, will retest after  . Abnormal Pap smear and cervical HPV (human papillomavirus)   . Anemia   . Depression   . Anxiety    Past Surgical History  Procedure Laterality Date  . No past surgeries     Family History: family history includes Diabetes in her paternal grandmother; Heart disease in her paternal grandmother; and Hypertension in her paternal grandmother.  There is no history of Other. Social History:  reports that she has never smoked. She has never used smokeless tobacco. She reports that she uses illicit drugs (Cocaine). She reports that she does not drink alcohol.   Prenatal Transfer Tool  Maternal Diabetes: No Genetic Screening: Declined Maternal Ultrasounds/Referrals: Declined Fetal Ultrasounds or other Referrals:  None Maternal Substance Abuse:  Yes:  Type: Cocaine Significant Maternal Medications:  None Significant Maternal Lab Results:  Lab values include: Group B Strep positive Other Comments:  Uses crack cocaine, no prenatal care  ROS Per HPI  Dilation: 4.5 Effacement (%): 90 Station:  -2 Blood pressure 128/52, pulse 85, temperature 98 F (36.7 C), temperature source Oral, resp. rate 20, height 5\' 3"  (1.6 m), weight 79.379 kg (175 lb), last menstrual period 01/28/2012, unknown if currently breastfeeding. Exam Physical Exam   Gen: NAD, alert, cooperative with exam HEENT: NCAT, MMM CV: RRR, good S1/S2, no murmur Resp: CTABL, no wheezes, non-labored Abd: Soft preganant abdomen Ext: No edema, warm Neuro: Alert and oriented, No gross deficits  FHT: Baseline 140, moderate variability, accels absent, decels absent Toco: q 1-2 minutes  Prenatal labs: ABO, Rh: --/--/A POS (02/08 2151) Antibody: NEG (02/08 2151) Rubella: 0.86 (02/08 2151) RPR: NON REACTIVE (02/08 2151)  HBsAg: NEGATIVE (02/08 2151)  HIV: NON REACTIVE (02/08 2151)  GBS:   positive  Assessment/Plan: 27 y/o [id number] here with SOL - Term pregnancy in active labor, membranes likely intact but not palpable - Pain control with epidural per request, PRN nubain until then - GBS + --> Ampicillin as delivery may be immanent in the multip - pregnancy notable for no prenatal care and cocaine use, UDS - Fetal strip category 1 - anticipate SVD  Kevin Fenton 11/26/2012, 5:35 AM  .I have seen the patient with the resident/student and agree with the above.  Tawnya Crook

## 2012-11-26 NOTE — Progress Notes (Signed)
Pt refusing BP cuff.  Instructed on use of taking blood pressures but pt was insistent.

## 2012-11-26 NOTE — Progress Notes (Signed)
Vomiting

## 2012-11-26 NOTE — Progress Notes (Signed)
Delivery Note At 7:24 AM a viable and healthy female was delivered via Vaginal, Spontaneous Delivery (Presentation: Right Occiput Anterior).  APGAR: 7, 9; weight 6 lb 13.7 oz (3110 g).   Placenta status: Intact, Spontaneous Pathology.  Cord: 3 vessels with the following complications: Short.  Cord pH: Pending  Anesthesia: None  Episiotomy: None Lacerations: None Suture Repair: N/A Est. Blood Loss (mL): 250  Mom to postpartum.  Baby to nursery-stable.  Kevin Fenton 11/26/2012, 8:24 AM

## 2012-11-26 NOTE — Progress Notes (Signed)
CSW made report to [facility] given her hx and will complete assessment with MOB tomorrow.

## 2012-11-26 NOTE — Progress Notes (Signed)
CSW received consult and is familiar with this patient from previous deliveries.  Bedside RN informed CSW that MOB is ready to speak with CSW as soon as possible.  CSW attempted to meet with MOB to complete assessment, but we only spoke briefly because she stated she is not ready to talk.  She states a friend is coming here and MOB wants CSW to return at that time.  CSW agreed and left contact number for MOB.

## 2012-11-27 LAB — CBC
HCT: 30 % — ABNORMAL LOW (ref 36.0–46.0)
Hemoglobin: 10 g/dL — ABNORMAL LOW (ref 12.0–15.0)
MCH: 29.4 pg (ref 26.0–34.0)
MCHC: 33.3 g/dL (ref 30.0–36.0)
RDW: 13.2 % (ref 11.5–15.5)

## 2012-11-27 MED ORDER — FAMOTIDINE 20 MG PO TABS
40.0000 mg | ORAL_TABLET | Freq: Once | ORAL | Status: AC
  Administered 2012-11-28: 40 mg via ORAL
  Filled 2012-11-27: qty 2

## 2012-11-27 MED ORDER — METOCLOPRAMIDE HCL 10 MG PO TABS
10.0000 mg | ORAL_TABLET | Freq: Once | ORAL | Status: AC
  Administered 2012-11-28: 10 mg via ORAL
  Filled 2012-11-27: qty 1

## 2012-11-27 MED ORDER — LACTATED RINGERS IV SOLN
INTRAVENOUS | Status: DC
  Administered 2012-11-28: 20 mL/h via INTRAVENOUS

## 2012-11-27 MED ORDER — FERROUS SULFATE 325 (65 FE) MG PO TABS
325.0000 mg | ORAL_TABLET | Freq: Three times a day (TID) | ORAL | Status: DC
  Administered 2012-11-27 – 2012-11-28 (×3): 325 mg via ORAL
  Filled 2012-11-27 (×3): qty 1

## 2012-11-27 NOTE — Progress Notes (Signed)
[facility]N Attending Note  Patient desires permanent sterilization.  Other reversible forms of contraception were discussed with patient; she declines all other modalities.  Discussed her situation with Dr. Brayton Caves, Anesthesiologist on call who wanted her scheduled at least 48 hours after her last crack use.  Patient reports last crack use on 11/25/12.  Risks of procedure discussed with patient including but not limited to: risk of regret, permanence of method, bleeding, infection, injury to surrounding organs and need for additional procedures.  Failure risk of 0.5-1% with increased risk of ectopic gestation if pregnancy occurs was also discussed with patient.  Patient verbalized understanding of these risks and wants to proceed with sterilization.  OR called and procedure scheduled for 11/28/12 at 1030 with Dr. Penne Lash.  Patient and her RN aware of this OR time; preoperative orders placed.  Jaynie Collins, MD, FACOG Attending Obstetrician & Gynecologist [facility], [facility] [place]

## 2012-11-27 NOTE — Progress Notes (Signed)
Post Partum Day 1 Subjective: no complaints, up ad lib, voiding, tolerating PO and + flatus  Objective: Blood pressure 122/74, pulse 82, temperature 98.5 F (36.9 C), temperature source Oral, resp. rate 18, height 5\' 3"  (1.6 m), weight 79.379 kg (175 lb), last menstrual period 01/28/2012, unknown if currently breastfeeding.  Physical Exam:  General: alert, cooperative and no distress Lochia: appropriate Uterine Fundus: firm Incision: None DVT Evaluation: No evidence of DVT seen on physical exam. Negative Homan's sign. No cords or calf tenderness.   Recent Labs  11/26/12 1607 11/27/12 0535  HGB 10.6* 10.0*  HCT 31.1* 30.0*    Assessment/Plan: Plan for discharge tomorrow, Social Work consult and Contraception Depo before dischrge, would like to sign papers to get a BTL.  Social work consulted due to drug use, they have notified CPS.    LOS: 1 day   Kevin Fenton 11/27/2012, 7:17 AM

## 2012-11-27 NOTE — Progress Notes (Signed)
I have seen the patient with the resident/student and agree with the above.  Regina Coleman  

## 2012-11-27 NOTE — Progress Notes (Signed)
UR chart review completed.  

## 2012-11-27 NOTE — Progress Notes (Signed)
I have seen and examined this patient and I agree with the above. Cam Hai 9:02 AM 11/27/2012

## 2012-11-28 ENCOUNTER — Telehealth: Payer: Self-pay | Admitting: Obstetrics & Gynecology

## 2012-11-28 ENCOUNTER — Inpatient Hospital Stay (HOSPITAL_COMMUNITY): Payer: Medicaid Other | Admitting: Registered Nurse

## 2012-11-28 ENCOUNTER — Encounter (HOSPITAL_COMMUNITY)
Admission: AD | Disposition: A | Discharge status: Home / Self Care | Payer: Self-pay | Source: Ambulatory Visit | Attending: Obstetrics & Gynecology

## 2012-11-28 ENCOUNTER — Encounter (HOSPITAL_COMMUNITY): Payer: Self-pay | Admitting: Registered Nurse

## 2012-11-28 SURGERY — LIGATION, FALLOPIAN TUBE, POSTPARTUM
Anesthesia: Spinal

## 2012-11-28 MED ORDER — IBUPROFEN 600 MG PO TABS: 600.0000 mg | ORAL_TABLET | Freq: Four times a day (QID) | ORAL | Status: DC

## 2012-11-28 MED ORDER — MORPHINE SULFATE 4 MG/ML IJ SOLN: 2.0000 mg | Freq: Once | INTRAMUSCULAR | Status: DC

## 2012-11-28 MED ORDER — MEDROXYPROGESTERONE ACETATE 150 MG/ML IM SUSP
150.0000 mg | Freq: Once | INTRAMUSCULAR | Status: AC
  Administered 2012-11-28: 150 mg via INTRAMUSCULAR
  Filled 2012-11-28: qty 1

## 2012-11-28 SURGICAL SUPPLY — 17 items
CHLORAPREP W/TINT 26ML (MISCELLANEOUS) ×1 IMPLANT
CLOTH BEACON ORANGE TIMEOUT ST (SAFETY) ×1 IMPLANT
GLOVE BIO SURGEON STRL SZ7 (GLOVE) ×1 IMPLANT
GLOVE BIOGEL PI IND STRL 7.0 (GLOVE) ×1 IMPLANT
GLOVE BIOGEL PI INDICATOR 7.0 (GLOVE)
GOWN PREVENTION PLUS LG XLONG (DISPOSABLE) ×2 IMPLANT
NEEDLE HYPO 22GX1.5 SAFETY (NEEDLE) ×1 IMPLANT
NS IRRIG 1000ML POUR BTL (IV SOLUTION) ×1 IMPLANT
PACK ABDOMINAL MINOR (CUSTOM PROCEDURE TRAY) ×1 IMPLANT
SPONGE LAP 4X18 X RAY DECT (DISPOSABLE) IMPLANT
SUT VIC AB 0 CT1 27 (SUTURE)
SUT VIC AB 0 CT1 27XBRD ANBCTR (SUTURE) ×1 IMPLANT
SUT VICRYL 4-0 PS2 18IN ABS (SUTURE) ×1 IMPLANT
SYR CONTROL 10ML LL (SYRINGE) ×1 IMPLANT
TOWEL OR 17X24 6PK STRL BLUE (TOWEL DISPOSABLE) ×2 IMPLANT
TRAY FOLEY CATH 14FR (SET/KITS/TRAYS/PACK) ×1 IMPLANT
WATER STERILE IRR 1000ML POUR (IV SOLUTION) ×1 IMPLANT

## 2012-11-28 NOTE — Discharge Summary (Signed)
Pt to be scheduled for March 26th for salpingectomy with Dr. Penne Lash.  She needs to make sure her Medicaid card is in current before the procedure.  Financial counseling is to speak with her today.

## 2012-11-28 NOTE — Clinical Social Work Maternal (Addendum)
Clinical Social Work Department PSYCHOSOCIAL ASSESSMENT - MATERNAL/CHILD 11/28/2012  Patient:  SHAHAD, MAZUREK  Account Number:  [id number]  Admit Date:  11/26/2012  Marjo Bicker Name:   Anderson Malta    Clinical Social Worker:  Nobie Putnam, LCSW   Date/Time:  11/28/2012 09:45 AM  Date Referred:  11/28/2012   Referral source  CN     Referred reason  Substance Abuse   Other referral source:    I:  FAMILY / HOME ENVIRONMENT Child's legal guardian:  PARENT  Guardian - Name Guardian - Age Guardian - Address  Illianna Paschal [address] [place], [place] [address]  Telford Nab  (same as above)   Other household support members/support persons Other support:   Mona, aunt    II  PSYCHOSOCIAL DATA Information Source:  Patient Interview  Event organiser Employment:   Financial resources:   If Medicaid - County:    School / Grade:   Maternity Care Coordinator / Child Services Coordination / Early Interventions:  Cultural issues impacting care:    III  STRENGTHS Strengths  Adequate Resources  Home prepared for Child (including basic supplies)  Supportive family/friends   Strength comment:    IV  RISK FACTORS AND CURRENT PROBLEMS Current Problem:  YES   Risk Factor & Current Problem Patient Issue Family Issue Risk Factor / Current Problem Comment  Substance Abuse Y N Hx of crack cocaine use    V  SOCIAL WORK ASSESSMENT CSW met with 27 year old, G10P8 referred for an assessment of her extensive substance abuse history.  This CSW is familiar with this pt & her history from previous pregnancies.  The pt started using cocaine at 27 years old & has continued to-date.  She does not have custody of any of her children.  Pt's aunt, has custody of 6 of her children.  The father of pt's youngest child lives with him.  She admits to using crack cocaine daily ($200 worth), during this pregnancy.  Pt is very familiar with CPS & aware that she will not be  allowed to take this infant home.  Pt was positive for cocaine on admission.  Infant's UDS is positive as well, meconium results are pending.  Pt asked this CSW to assist her into getting into a substance abuse treatment program that will allow her to take the infant. CSW completed an application packet with the pt for [facility], tx facility in [place], [place].  Pt was accepted into the program & can be admitted on Monday, March 3rd. According to the pt, her baby love worker, Carolan Shiver, has agreed to provide transportation to the facility.  CSW made it clear to the pt, that CPS has to make the final decision about her going to treatment center with the infant.  CSW explained that her history is so extensive, they may not approve the plan.  Pt seems to understand. FOB is at the bedside & supportive.  He states he is able to care for the infant but would like to see the pt receive treatment for her addiction.  CPS spoke with pt's CPS worker, Marcelle Smiling to discuss discharge plans & inform of pt's proposed discharge plan. A Team Decision Meeting, (TDM) is scheduled for Friday at 9a.m. (since pt is schedule of a tubal at 10:30 today).  CPS worker came to meet with the pt this morning & advised her that she would not be allowed to go to treatment with the infant, due  to her history.  CSW will continue to work with pt & try to find a treatment center that will accept her without the infant, if she is interested.  CSW will follow up with pt once she returns from surgery.  CSW will continue to follow this pt & facilitate appropriate discharge of the infant.      VI SOCIAL WORK PLAN Social Work Plan  Psychosocial Support/Ongoing Assessment of Needs   Type of pt/family education:   If child protective services report - county:   If child protective services report - date:   Information/referral to community resources comment:   Other social work plan:

## 2012-11-28 NOTE — Telephone Encounter (Signed)
Did not call pat.  Opened encounter in error.  Pt is till an inpatient.

## 2012-11-28 NOTE — Anesthesia Preprocedure Evaluation (Signed)
Anesthesia Evaluation  Patient identified by MRN, date of birth, ID band Patient awake    Reviewed: Allergy & Precautions, H&P , Patient's Chart, lab work & pertinent test results  Airway Mallampati: II      Dental no notable dental hx.    Pulmonary neg pulmonary ROS,  breath sounds clear to auscultation  Pulmonary exam normal       Cardiovascular Exercise Tolerance: Good negative cardio ROS  Rhythm:regular Rate:Normal     Neuro/Psych PSYCHIATRIC DISORDERS Anxiety Depression negative neurological ROS  negative psych ROS   GI/Hepatic negative GI ROS, Neg liver ROS,   Endo/Other  negative endocrine ROS  Renal/GU negative Renal ROS  negative genitourinary   Musculoskeletal   Abdominal Normal abdominal exam  (+)   Peds  Hematology negative hematology ROS (+)   Anesthesia Other Findings Genital herpes   Also Chlamydia, GC, HPV and Trich Mental disorder        Bipolar 2 disorder   pt says r/o bipolar. ADHD (attention deficit hyperactivity disorder)        Abnormal Pap smear   during preg, will retest after Abnormal Pap smear and cervical HPV (human papillomavirus)        Anemia     Depression        Anxiety cocaine use   Reproductive/Obstetrics negative OB ROS                           Anesthesia Physical Anesthesia Plan  ASA: III  Anesthesia Plan: Spinal   Post-op Pain Management:    Induction:   Airway Management Planned:   Additional Equipment:   Intra-op Plan:   Post-operative Plan:   Informed Consent: I have reviewed the patients History and Physical, chart, labs and discussed the procedure including the risks, benefits and alternatives for the proposed anesthesia with the patient or authorized representative who has indicated his/her understanding and acceptance.     Plan Discussed with: Anesthesiologist, CRNA and Surgeon  Anesthesia Plan Comments:          Anesthesia Quick Evaluation

## 2012-11-28 NOTE — Discharge Summary (Signed)
Obstetric Discharge Summary Regina Coleman is a 27 y.o. G95A2130 presenting at [redacted]w[redacted]d in active labor with no prenatal care. She had a normal spontaneous vaginal delivery with postpartum hemorrhage. Social work was consulted for substance abuse issues, and CPS was notified. Patient was to undergo postpartum BTL but declined on day of discharge. She was give a Depo Provera injection prior to discharge and will follow up with [facility] for interval laparascopic BTL.  Baby is to remain in hospital until custody decided.  Reason for Admission: onset of labor Prenatal Procedures: none Intrapartum Procedures: spontaneous vaginal delivery Postpartum Procedures: none Complications-Operative and Postpartum: hemorrhage now resolved Hemoglobin  Date Value Range Status  11/27/2012 10.0* 12.0 - 15.0 g/dL Final     HCT  Date Value Range Status  11/27/2012 30.0* 36.0 - 46.0 % Final    Physical Exam:  General: alert, cooperative and no distress Lochia: appropriate Uterine Fundus: firm Incision: N/A DVT Evaluation: No evidence of DVT seen on physical exam. Negative Homan's sign. No cords or calf tenderness.  Discharge Diagnoses: Term Pregnancy-delivered  Discharge Information: Date: 11/28/2012 Activity: pelvic rest Diet: routine Medications: None Condition: stable Instructions: refer to practice specific booklet Discharge to: Home  Follow-up Information   Follow up with [facility] In 6 weeks. (You will be called to schedule an appt for laparascopic tubal ligation and postpartum care.)    Contact information:   [address] [place] [address] [phone]     Newborn Data: Live born female  Birth Weight: 6 lb 13.7 oz (3110 g) APGAR: 7, 9  Baby to remain in hospital until social work/CPS custody decision.   Kevin Fenton 11/28/2012, 8:46 AM  I saw and examined patient and agree with above resident note. I reviewed history, delivery note, progress  notes, and labs. Napoleon Form, MD

## 2012-11-28 NOTE — Progress Notes (Signed)
Patient prepped for tubal ligation.  She left the unit at 0945 via stretcher and returned to her room 5 minutes later  Stating she did not want to go through with the tubal today.. She states she wants to have it in 6 weeks under general anesthesia.

## 2012-11-28 NOTE — Progress Notes (Signed)
Patient prepped for Tubal Ligation.  Pre op meds given, IV started, CHG bath done,  OR gown given.  Pt left floor at 0945 via stretcher.  She returned to her room 5 minutes later stating she changed her mind and did not want to go through with the tubal today.  She states she wants to have it in 6 weeks and have general anesthesia.

## 2012-12-02 ENCOUNTER — Encounter: Payer: Self-pay | Admitting: *Deleted

## 2012-12-10 ENCOUNTER — Encounter (HOSPITAL_COMMUNITY): Payer: Self-pay | Admitting: Pharmacist

## 2012-12-10 LAB — QUANTIFERON TB GOLD ASSAY (BLOOD)
Interferon Gamma Release Assay: NEGATIVE
Mitogen value: >10 IU/mL
TB Antigen Minus Nil Value: 0 IU/mL

## 2012-12-10 LAB — HEPATITIS C ANTIBODY (REFLEX): HCV Ab: NEGATIVE

## 2012-12-11 ENCOUNTER — Ambulatory Visit: Payer: Self-pay | Admitting: Obstetrics & Gynecology

## 2012-12-23 ENCOUNTER — Inpatient Hospital Stay (HOSPITAL_COMMUNITY): Admission: RE | Admit: 2012-12-23 | Payer: Medicaid Other | Source: Ambulatory Visit

## 2012-12-25 ENCOUNTER — Ambulatory Visit (HOSPITAL_COMMUNITY)
Admission: RE | Admit: 2012-12-25 | Payer: Medicaid Other | Source: Ambulatory Visit | Admitting: Obstetrics & Gynecology

## 2012-12-26 ENCOUNTER — Encounter (HOSPITAL_COMMUNITY): Admission: RE | Payer: Self-pay | Source: Ambulatory Visit

## 2012-12-26 ENCOUNTER — Ambulatory Visit: Payer: Self-pay | Admitting: Obstetrics & Gynecology

## 2012-12-26 SURGERY — LAPAROSCOPY OPERATIVE
Anesthesia: Choice | Site: Abdomen

## 2013-01-01 ENCOUNTER — Encounter (HOSPITAL_COMMUNITY): Payer: Self-pay | Admitting: Emergency Medicine

## 2013-01-01 ENCOUNTER — Emergency Department (HOSPITAL_COMMUNITY)
Admission: EM | Admit: 2013-01-01 | Discharge: 2013-01-02 | Disposition: A | Discharge status: Home / Self Care | Payer: Medicaid Other | Attending: Emergency Medicine | Admitting: Emergency Medicine

## 2013-01-01 DIAGNOSIS — Z8659 Personal history of other mental and behavioral disorders: Secondary | ICD-10-CM | POA: Insufficient documentation

## 2013-01-01 DIAGNOSIS — Z862 Personal history of diseases of the blood and blood-forming organs and certain disorders involving the immune mechanism: Secondary | ICD-10-CM | POA: Insufficient documentation

## 2013-01-01 DIAGNOSIS — Z8619 Personal history of other infectious and parasitic diseases: Secondary | ICD-10-CM | POA: Insufficient documentation

## 2013-01-01 DIAGNOSIS — F1410 Cocaine abuse, uncomplicated: Secondary | ICD-10-CM | POA: Insufficient documentation

## 2013-01-01 DIAGNOSIS — O99345 Other mental disorders complicating the puerperium: Secondary | ICD-10-CM | POA: Insufficient documentation

## 2013-01-01 DIAGNOSIS — F1490 Cocaine use, unspecified, uncomplicated: Secondary | ICD-10-CM

## 2013-01-01 LAB — COMPREHENSIVE METABOLIC PANEL
ALT: 13 U/L (ref 0–35)
AST: 13 U/L (ref 0–37)
Alkaline Phosphatase: 68 U/L (ref 39–117)
CO2: 24 mEq/L (ref 19–32)
Calcium: 8.9 mg/dL (ref 8.4–10.5)
GFR calc non Af Amer: >90 mL/min (ref >90)
Potassium: 4.1 mEq/L (ref 3.5–5.1)
Sodium: 139 mEq/L (ref 135–145)
Total Protein: 6.8 g/dL (ref 6.0–8.3)

## 2013-01-01 LAB — CBC WITH DIFFERENTIAL/PLATELET
Basophils Absolute: 0 10*3/uL (ref 0.0–0.1)
Eosinophils Relative: 4 % (ref 0–5)
Lymphocytes Relative: 43 % (ref 12–46)
MCV: 88.5 fL (ref 78.0–100.0)
Neutrophils Relative %: 44 % (ref 43–77)
Platelets: 340 10*3/uL (ref 150–400)
RBC: 4.01 MIL/uL (ref 3.87–5.11)
RDW: 12.9 % (ref 11.5–15.5)
WBC: 5.7 10*3/uL (ref 4.0–10.5)

## 2013-01-01 LAB — URINE MICROSCOPIC-ADD ON

## 2013-01-01 LAB — URINALYSIS, ROUTINE W REFLEX MICROSCOPIC
Bilirubin Urine: NEGATIVE
Ketones, ur: NEGATIVE mg/dL
Nitrite: NEGATIVE
Urobilinogen, UA: 1 mg/dL (ref 0.0–1.0)

## 2013-01-01 LAB — RAPID URINE DRUG SCREEN, HOSP PERFORMED
Amphetamines: NOT DETECTED
Barbiturates: NOT DETECTED
Benzodiazepines: NOT DETECTED

## 2013-01-01 LAB — ETHANOL: Alcohol, Ethyl (B): <11 mg/dL (ref 0–11)

## 2013-01-01 MED ORDER — HYDROXYZINE HCL 25 MG PO TABS: 25.0000 mg | ORAL_TABLET | Freq: Four times a day (QID) | ORAL | Status: DC | PRN

## 2013-01-01 MED ORDER — NICOTINE 21 MG/24HR TD PT24: 21.0000 mg | MEDICATED_PATCH | Freq: Every day | TRANSDERMAL | Status: DC

## 2013-01-01 MED ORDER — ALUM & MAG HYDROXIDE-SIMETH 200-200-20 MG/5ML PO SUSP: 30.0000 mL | ORAL | Status: DC | PRN

## 2013-01-01 MED ORDER — ACETAMINOPHEN 325 MG PO TABS: 650.0000 mg | ORAL_TABLET | ORAL | Status: DC | PRN

## 2013-01-01 MED ORDER — DICYCLOMINE HCL 20 MG PO TABS: 20.0000 mg | ORAL_TABLET | Freq: Four times a day (QID) | ORAL | Status: DC | PRN

## 2013-01-01 MED ORDER — ONDANSETRON 4 MG PO TBDP: 4.0000 mg | ORAL_TABLET | Freq: Four times a day (QID) | ORAL | Status: DC | PRN

## 2013-01-01 MED ORDER — IBUPROFEN 600 MG PO TABS: 600.0000 mg | ORAL_TABLET | Freq: Three times a day (TID) | ORAL | Status: DC | PRN

## 2013-01-01 MED ORDER — LOPERAMIDE HCL 2 MG PO CAPS: 2.0000 mg | ORAL_CAPSULE | ORAL | Status: DC | PRN

## 2013-01-01 MED ORDER — METHOCARBAMOL 500 MG PO TABS: 500.0000 mg | ORAL_TABLET | Freq: Three times a day (TID) | ORAL | Status: DC | PRN

## 2013-01-01 MED ORDER — ONDANSETRON HCL 4 MG PO TABS: 4.0000 mg | ORAL_TABLET | Freq: Three times a day (TID) | ORAL | Status: DC | PRN

## 2013-01-01 MED ORDER — ZOLPIDEM TARTRATE 5 MG PO TABS: 5.0000 mg | ORAL_TABLET | Freq: Every evening | ORAL | Status: DC | PRN

## 2013-01-01 MED ORDER — NAPROXEN 500 MG PO TABS
500.0000 mg | ORAL_TABLET | Freq: Two times a day (BID) | ORAL | Status: DC | PRN
  Administered 2013-01-01: 500 mg via ORAL
  Filled 2013-01-01: qty 1

## 2013-01-01 NOTE — ED Provider Notes (Signed)
History  This chart was scribed for non-physician practitioner working with Laray Anger, DO by Ardeen Jourdain, ED Scribe. This patient was seen in room [facility]3/[facility] and the patient's care was started at 1709.  CSN: [id number]  Arrival date & time 01/01/13  1637   First MD Initiated Contact with Patient 01/01/13 1709      Chief Complaint  Patient presents with  . Medical Clearance     The history is provided by the patient. No language interpreter was used.    Regina Coleman is a 27 y.o. female with a h/o bipolar 2 disorder, anxiety and depression who presents to the Emergency Department complaining of needing rehabilitation for crack/cocaine as well as post partum depression. She states she delivered in February. She states she has been using crack/cocaine since she was 13. She states she used the substance during pregnancy. She states the only time she has been clean was when she was in jail. She is not c/o any other symptoms at this time.    Past Medical History  Diagnosis Date  . Genital herpes     Also Chlamydia, GC, HPV and Trich  . Mental disorder   . Bipolar 2 disorder     pt says r/o bipolar.  . ADHD (attention deficit hyperactivity disorder)   . Abnormal Pap smear     during preg, will retest after  . Abnormal Pap smear and cervical HPV (human papillomavirus)   . Anemia   . Depression   . Anxiety     Past Surgical History  Procedure Laterality Date  . No past surgeries      Family History  Problem Relation Age of Onset  . Diabetes Paternal Grandmother   . Heart disease Paternal Grandmother   . Hypertension Paternal Grandmother   . Other Neg Hx     History  Substance Use Topics  . Smoking status: Never Smoker   . Smokeless tobacco: Never Used  . Alcohol Use: No    OB History   Grav Para Term Preterm Abortions TAB SAB Ect Mult Living   10 8 8  0 2 0 2 0 0 8      Review of Systems  Constitutional: Negative for fever and chills.   Respiratory: Negative for shortness of breath.   Gastrointestinal: Negative for nausea and vomiting.  Neurological: Negative for weakness.  All other systems reviewed and are negative.    Allergies  Review of patient's allergies indicates no known allergies.  Home Medications   Current Outpatient Rx  Name  Route  Sig  Dispense  Refill  . ibuprofen (ADVIL,MOTRIN) 200 MG tablet   Oral   Take 600 mg by mouth every 8 (eight) hours as needed for pain.         . medroxyPROGESTERone (DEPO-PROVERA) 150 MG/ML injection   Intramuscular   Inject 150 mg into the muscle every 3 (three) months.           Triage Vitals: BP 144/78  Pulse 99  Temp(Src) 98.7 F (37.1 C) (Oral)  SpO2 99%  Physical Exam  Nursing note and vitals reviewed. Constitutional: She is oriented to person, place, and time. She appears well-developed and well-nourished. No distress.  HENT:  Head: Normocephalic and atraumatic.  Eyes: EOM are normal. Pupils are equal, round, and reactive to light.  Neck: Normal range of motion. Neck supple. No tracheal deviation present.  Cardiovascular: Normal rate.   Pulmonary/Chest: Effort normal. No respiratory distress.  Abdominal: Soft. She exhibits no  distension.  Musculoskeletal: Normal range of motion. She exhibits no edema.  Neurological: She is alert and oriented to person, place, and time.  Skin: Skin is warm and dry.  Psychiatric: Her behavior is normal. Her mood appears anxious. She exhibits a depressed mood. She expresses no homicidal and no suicidal ideation. She expresses no suicidal plans and no homicidal plans.    ED Course  Procedures (including critical care time)  DIAGNOSTIC STUDIES: Oxygen Saturation is 99% on room air, normal by my interpretation.    COORDINATION OF CARE:  5:25 PM: Discussed treatment plan which includes CBC, CMP, urine rapid drug screen, EtOH and UA with pt at bedside and pt agreed to plan.    Labs Reviewed  CBC WITH  DIFFERENTIAL  COMPREHENSIVE METABOLIC PANEL  URINE RAPID DRUG SCREEN (HOSP PERFORMED)  ETHANOL  URINALYSIS, ROUTINE W REFLEX MICROSCOPIC   No results found.   1. Post partum depression   2. Crack cocaine use       MDM  ACT consulted. Holding orders placed, med rec completed.  Dorthula Matas, PA-C 01/02/13 [phone]
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
## 2013-01-01 NOTE — ED Notes (Signed)
Pt. Has one belonging bag.  

## 2013-01-01 NOTE — ED Notes (Addendum)
Pt states that she has been using crack and wants detox has been using for many years, the last use was yesterday. Voluntary here. Pt just had a baby feb 25 and is post pardum depression.

## 2013-01-02 ENCOUNTER — Encounter (HOSPITAL_COMMUNITY): Payer: Self-pay | Admitting: *Deleted

## 2013-01-02 ENCOUNTER — Ambulatory Visit: Payer: Self-pay | Admitting: Obstetrics & Gynecology

## 2013-01-02 NOTE — ED Provider Notes (Signed)
Medical screening examination/treatment/procedure(s) were performed by non-physician practitioner and as supervising physician I was immediately available for consultation/collaboration.   Geonna Lockyer M Kanai Hilger, DO 01/02/13 2038 

## 2013-01-02 NOTE — BH Assessment (Signed)
Assessment Note   Regina Coleman is a 27 y.o. female who presents to [facility] requesting detox from crack.  Pt denies SI/HI/Psych.  Pt says she been using crack since age 11, using $200-$300 daily, last use was 01/01/13.  Pt says she is homeless and wants to stop using because wants to get her children back.  Pt has 8 children--none of which reside with patient, she has an infant that was taken by CPS and placed in foster care.  Pt says child's father is attempting to gain custody of baby.  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 This counselor informed pt that detox is not available crack/cocaine, referrals can be provided for rehab facilities and pt will have to seek treatment on her own.  Pt will be d/c'd in the am.  Axis I: Cocaine Dependence  Axis II: Deferred Axis III:  Past Medical History  Diagnosis Date  . Genital herpes     Also Chlamydia, GC, HPV and Trich  . Mental disorder   . Bipolar 2 disorder     pt says r/o bipolar.  . ADHD (attention deficit hyperactivity disorder)   . Abnormal Pap smear     during preg, will retest after  . Abnormal Pap smear and cervical HPV (human papillomavirus)   . Anemia   . Depression   . Anxiety    Axis IV: economic problems, housing problems, other psychosocial or environmental problems, problems related to social environment and problems with primary support group Axis V: 51-60 moderate symptoms  Past Medical History:  Past Medical History  Diagnosis Date  . Genital herpes     Also Chlamydia, GC, HPV and Trich  . Mental disorder   . Bipolar 2 disorder     pt says r/o bipolar.  . ADHD (attention deficit hyperactivity disorder)   . Abnormal Pap smear     during preg, will retest after  . Abnormal Pap smear and cervical HPV (human papillomavirus)   . Anemia   . Depression   . Anxiety     Past Surgical History  Procedure Laterality Date  . No past surgeries      Family History:  Family History  Problem Relation  Age of Onset  . Diabetes Paternal Grandmother   . Heart disease Paternal Grandmother   . Hypertension Paternal Grandmother   . Other Neg Hx     Social History:  reports that she has never smoked. She has never used smokeless tobacco. She reports that she uses illicit drugs ("Crack" cocaine). She reports that she does not drink alcohol.  Additional Social History:  Alcohol / Drug Use Pain Medications: None  Prescriptions: None  Over the Counter: None  History of alcohol / drug use?: Yes Longest period of sobriety (when/how long): Only when in Rehab  Negative Consequences of Use: Financial;Personal relationships;Work / Programmer, multimedia Withdrawal Symptoms: Other (Comment) (No w/d sxs) Substance #1 Name of Substance 1: Crack/Cocaine  1 - Age of First Use: 13 YOF 1 - Amount (size/oz): $200-$300 1 - Frequency: Daily  1 - Duration: On-going  1 - Last Use / Amount: 01/02/12  CIWA: CIWA-Ar BP: 132/81 mmHg Pulse Rate: 80 Nausea and Vomiting: no nausea and no vomiting Tactile Disturbances: none Tremor: no tremor Auditory Disturbances: not present Paroxysmal Sweats: no sweat visible Visual Disturbances: not present Anxiety: no anxiety, at ease Headache, Fullness in Head: none present Agitation: normal activity Orientation and Clouding of Sensorium: oriented and can do serial additions CIWA-Ar Total:  0 COWS:    Allergies: No Known Allergies  Home Medications:  (Not in a hospital admission)  OB/GYN Status:  No LMP recorded.  General Assessment Data Location of Assessment: [facility] Living Arrangements: Other (Comment) (Homeless ) Can pt return to current living arrangement?: Yes Admission Status: Voluntary Is patient capable of signing voluntary admission?: Yes Transfer from: Acute Hospital Referral Source: MD  Education Status Is patient currently in school?: No Current Grade: None  Highest grade of school patient has completed: None  Name of school: None  Contact person: None    Risk to self Suicidal Ideation: No Suicidal Intent: No Is patient at risk for suicide?: No Suicidal Plan?: No Access to Means: No What has been your use of drugs/alcohol within the last 12 months?: Abusing: crack Previous Attempts/Gestures: No How many times?: 0 Other Self Harm Risks: None  Triggers for Past Attempts: None known Intentional Self Injurious Behavior: None Family Suicide History: No Recent stressful life event(s):  (Homeless; CPS involvement ) Persecutory voices/beliefs?: No Depression: Yes Depression Symptoms: Loss of interest in usual pleasures Substance abuse history and/or treatment for substance abuse?: Yes Suicide prevention information given to non-admitted patients: Not applicable  Risk to Others Homicidal Ideation: No Thoughts of Harm to Others: No Current Homicidal Intent: No Current Homicidal Plan: No Access to Homicidal Means: No Identified Victim: None  History of harm to others?: No Assessment of Violence: None Noted Violent Behavior Description: None  Does patient have access to weapons?: No Criminal Charges Pending?: No Does patient have a court date: No  Psychosis Hallucinations: None noted Delusions: None noted  Mental Status Report Appear/Hygiene: Disheveled;Poor hygiene Eye Contact: Fair Motor Activity: Unremarkable Speech: Logical/coherent Level of Consciousness: Alert Mood: Depressed Affect: Depressed Anxiety Level: None Thought Processes: Coherent;Relevant Judgement: Unimpaired Orientation: Person;Place;Time;Situation Obsessive Compulsive Thoughts/Behaviors: None  Cognitive Functioning Concentration: Normal Memory: Recent Intact;Remote Intact IQ: Average Insight: Fair Impulse Control: Fair Appetite: Good Weight Loss: 0 Weight Gain: 0 Sleep: No Change Total Hours of Sleep: 8 Vegetative Symptoms: None  ADLScreening [facility] Assessment Services) Patient's cognitive ability adequate to safely complete daily  activities?: Yes Patient able to express need for assistance with ADLs?: Yes Independently performs ADLs?: Yes (appropriate for developmental age)  Abuse/Neglect [facility]) Physical Abuse: Denies Verbal Abuse: Denies Sexual Abuse: Denies  Prior Inpatient Therapy Prior Inpatient Therapy: Yes Prior Therapy Dates: Unk  Prior Therapy Facilty/Provider(s): [facility], [place], [facility] Reason for Treatment: Rehab   Prior Outpatient Therapy Prior Outpatient Therapy: No Prior Therapy Dates: None  Prior Therapy Facilty/Provider(s): None  Reason for Treatment: None   ADL Screening (condition at time of admission) Patient's cognitive ability adequate to safely complete daily activities?: Yes Patient able to express need for assistance with ADLs?: Yes Independently performs ADLs?: Yes (appropriate for developmental age) Weakness of Legs: None Weakness of Arms/Hands: None  Home Assistive Devices/Equipment Home Assistive Devices/Equipment: None  Therapy Consults (therapy consults require a physician order) PT Evaluation Needed: No OT Evalulation Needed: No SLP Evaluation Needed: No Abuse/Neglect Assessment (Assessment to be complete while patient is alone) Physical Abuse: Denies Verbal Abuse: Denies Sexual Abuse: Denies Exploitation of patient/patient's resources: Denies Self-Neglect: Denies Values / Beliefs Cultural Requests During Hospitalization: None Spiritual Requests During Hospitalization: None Consults Spiritual Care Consult Needed: No Social Work Consult Needed: No Merchant navy officer (For Healthcare) Advance Directive: Patient does not have advance directive;Patient would not like information Pre-existing out of facility DNR order (yellow form or pink MOST form): No Nutrition Screen- [facility] Patient's home  diet: Regular Have you recently lost weight without trying?: No Have you been eating poorly because of a decreased appetite?: No Malnutrition Screening Tool  Score: 0  Additional Information 1:1 In Past 12 Months?: No CIRT Risk: No Elopement Risk: No Does patient have medical clearance?: Yes     Disposition:  Disposition Initial Assessment Completed for this Encounter: Yes Disposition of Patient: Referred to;Outpatient treatment Type of outpatient treatment: Adult Patient referred to: Outpatient clinic referral  On Site Evaluation by:   Reviewed with Physician:     Beatrix Shipper C 01/02/2013 2:30 AM

## 2013-01-09 ENCOUNTER — Ambulatory Visit: Payer: Self-pay | Admitting: Obstetrics & Gynecology

## 2013-06-26 ENCOUNTER — Emergency Department (HOSPITAL_COMMUNITY)
Admission: EM | Admit: 2013-06-26 | Discharge: 2013-06-26 | Disposition: A | Discharge status: Home / Self Care | Payer: Medicaid Other | Attending: Emergency Medicine | Admitting: Emergency Medicine

## 2013-06-26 ENCOUNTER — Encounter (HOSPITAL_COMMUNITY): Payer: Self-pay | Admitting: Emergency Medicine

## 2013-06-26 ENCOUNTER — Emergency Department (HOSPITAL_COMMUNITY): Payer: Medicaid Other

## 2013-06-26 DIAGNOSIS — Z8619 Personal history of other infectious and parasitic diseases: Secondary | ICD-10-CM | POA: Insufficient documentation

## 2013-06-26 DIAGNOSIS — O9989 Other specified diseases and conditions complicating pregnancy, childbirth and the puerperium: Secondary | ICD-10-CM | POA: Insufficient documentation

## 2013-06-26 DIAGNOSIS — Z3490 Encounter for supervision of normal pregnancy, unspecified, unspecified trimester: Secondary | ICD-10-CM

## 2013-06-26 DIAGNOSIS — Z8659 Personal history of other mental and behavioral disorders: Secondary | ICD-10-CM | POA: Insufficient documentation

## 2013-06-26 DIAGNOSIS — Z862 Personal history of diseases of the blood and blood-forming organs and certain disorders involving the immune mechanism: Secondary | ICD-10-CM | POA: Insufficient documentation

## 2013-06-26 DIAGNOSIS — S0990XA Unspecified injury of head, initial encounter: Secondary | ICD-10-CM | POA: Insufficient documentation

## 2013-06-26 DIAGNOSIS — T7411XA Adult physical abuse, confirmed, initial encounter: Secondary | ICD-10-CM | POA: Insufficient documentation

## 2013-06-26 LAB — URINALYSIS, ROUTINE W REFLEX MICROSCOPIC
Bilirubin Urine: NEGATIVE
Ketones, ur: NEGATIVE mg/dL
Nitrite: NEGATIVE
Protein, ur: NEGATIVE mg/dL
Specific Gravity, Urine: 1.038 — ABNORMAL HIGH (ref 1.005–1.030)
Urobilinogen, UA: 1 mg/dL (ref 0.0–1.0)

## 2013-06-26 LAB — URINE MICROSCOPIC-ADD ON

## 2013-06-26 MED ORDER — MORPHINE SULFATE 4 MG/ML IJ SOLN
4.0000 mg | Freq: Once | INTRAMUSCULAR | Status: AC
  Administered 2013-06-26: 4 mg via INTRAMUSCULAR
  Filled 2013-06-26: qty 1

## 2013-06-26 MED ORDER — ACETAMINOPHEN 500 MG PO TABS
1000.0000 mg | ORAL_TABLET | Freq: Once | ORAL | Status: AC
  Administered 2013-06-26: 1000 mg via ORAL
  Filled 2013-06-26: qty 2

## 2013-06-26 NOTE — ED Notes (Signed)
RN assessed pt head after reports of blood, no blood seen on chuck or on head.
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
## 2013-06-26 NOTE — ED Notes (Signed)
Pt states she used cocaine yesterday, and is a daily user.
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
## 2013-06-28 LAB — URINE CULTURE
Colony Count: NO GROWTH
Culture: NO GROWTH

## 2013-07-16 ENCOUNTER — Encounter (HOSPITAL_COMMUNITY): Payer: Self-pay | Admitting: *Deleted

## 2013-07-16 ENCOUNTER — Inpatient Hospital Stay (HOSPITAL_COMMUNITY): Payer: Medicaid Other

## 2013-07-16 ENCOUNTER — Inpatient Hospital Stay (HOSPITAL_COMMUNITY)
Admission: AD | Admit: 2013-07-16 | Discharge: 2013-07-16 | Disposition: A | Discharge status: Home / Self Care | Payer: Medicaid Other | Source: Ambulatory Visit | Attending: Obstetrics & Gynecology | Admitting: Obstetrics & Gynecology

## 2013-07-16 DIAGNOSIS — Z7251 High risk heterosexual behavior: Secondary | ICD-10-CM

## 2013-07-16 DIAGNOSIS — F1410 Cocaine abuse, uncomplicated: Secondary | ICD-10-CM | POA: Insufficient documentation

## 2013-07-16 DIAGNOSIS — O99340 Other mental disorders complicating pregnancy, unspecified trimester: Secondary | ICD-10-CM | POA: Insufficient documentation

## 2013-07-16 DIAGNOSIS — O200 Threatened abortion: Secondary | ICD-10-CM | POA: Insufficient documentation

## 2013-07-16 HISTORY — DX: Cocaine abuse, uncomplicated: F14.10

## 2013-07-16 LAB — RAPID URINE DRUG SCREEN, HOSP PERFORMED
Barbiturates: NOT DETECTED
Tetrahydrocannabinol: NOT DETECTED

## 2013-07-16 LAB — CBC
HCT: 30.5 % — ABNORMAL LOW (ref 36.0–46.0)
Hemoglobin: 10.7 g/dL — ABNORMAL LOW (ref 12.0–15.0)
RBC: 3.63 MIL/uL — ABNORMAL LOW (ref 3.87–5.11)
WBC: 11.8 10*3/uL — ABNORMAL HIGH (ref 4.0–10.5)

## 2013-07-16 LAB — URINALYSIS, ROUTINE W REFLEX MICROSCOPIC
Leukocytes, UA: NEGATIVE
Nitrite: NEGATIVE
Specific Gravity, Urine: 1.02 (ref 1.005–1.030)
Urobilinogen, UA: 0.2 mg/dL (ref 0.0–1.0)
pH: 6 (ref 5.0–8.0)

## 2013-07-16 LAB — URINE MICROSCOPIC-ADD ON

## 2013-07-16 LAB — ABO/RH: ABO/RH(D): A POS

## 2013-07-16 LAB — HCG, QUANTITATIVE, PREGNANCY: hCG, Beta Chain, Quant, S: 29936 m[IU]/mL — ABNORMAL HIGH (ref <5)

## 2013-07-16 MED ORDER — LACTATED RINGERS IV BOLUS (SEPSIS): 1000.0000 mL | Freq: Once | INTRAVENOUS | Status: DC

## 2013-07-16 NOTE — MAU Note (Signed)
Arrived via EMS. Unknown gest. States she started bleeding and leaking about 1 hour ago. Known crack cocaine use. C/O some cramping. States she has a 15 month old baby. Per EMS, patient does not have custody of children due to drug use.

## 2013-07-16 NOTE — MAU Provider Note (Signed)
History     CSN: [id number]  Arrival date and time: 07/16/13 7829   None     Chief Complaint  Patient presents with  . Vaginal Bleeding   HPI Comments: SONI KEGEL 26 y.o. [id number] presents to MAU via EMS with vaginal bleeding that started this morning. 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 Admits last drug use was 2 days ago. Denies any pain.     Patient is a 27 y.o. female presenting with vaginal bleeding.  Vaginal Bleeding      Past Medical History  Diagnosis Date  . Genital herpes     Also Chlamydia, GC, HPV and Trich  . Mental disorder   . Bipolar 2 disorder     pt says r/o bipolar.  . ADHD (attention deficit hyperactivity disorder)   . Abnormal Pap smear     during preg, will retest after  . Abnormal Pap smear and cervical HPV (human papillomavirus)   . Anemia   . Depression   . Anxiety   . Cocaine abuse     Past Surgical History  Procedure Laterality Date  . No past surgeries      Family History  Problem Relation Age of Onset  . Diabetes Paternal Grandmother   . Heart disease Paternal Grandmother   . Hypertension Paternal Grandmother   . Other Neg Hx     History  Substance Use Topics  . Smoking status: Never Smoker   . Smokeless tobacco: Never Used  . Alcohol Use: No    Allergies: No Known Allergies  Prescriptions prior to admission  Medication Sig Dispense Refill  . ibuprofen (ADVIL,MOTRIN) 200 MG tablet Take 600 mg by mouth every 8 (eight) hours as needed for pain.        Review of Systems  Constitutional: Negative.   Gastrointestinal:       Heavy vaginal bleeding  Genitourinary: Positive for vaginal bleeding.  Psychiatric/Behavioral: Positive for substance abuse. The patient is nervous/anxious.    Physical Exam   Blood pressure 124/77, pulse 79, temperature 98.4 F (36.9 C), temperature source Oral, resp. rate 20, SpO2 100.00%.  Physical Exam    Constitutional: She appears well-developed and well-nourished. She appears distressed.  Emotionally hysterical in initial exam, calmed after ultrasound  HENT:  Head: Normocephalic and atraumatic.  Eyes: Pupils are equal, round, and reactive to light.  Cardiovascular: Normal rate and regular rhythm.   Respiratory: Effort normal and breath sounds normal.  GI: Soft. There is tenderness.  Genitourinary:  Pt was violently opposed to vaginal exam. Bleeding heavily, clots fist sized  Psychiatric:  Tearing, jerking about bed, uncooperative with pelvic exam. Calmed after awhile   Results for orders placed during the hospital encounter of 07/16/13 (from the past 24 hour(s))  URINE RAPID DRUG SCREEN (HOSP PERFORMED)     Status: Abnormal   Collection Time    07/16/13  8:40 AM      Result Value Range   Opiates NONE DETECTED  NONE DETECTED   Cocaine POSITIVE (*) NONE DETECTED   Benzodiazepines NONE DETECTED  NONE DETECTED   Amphetamines NONE DETECTED  NONE DETECTED   Tetrahydrocannabinol NONE DETECTED  NONE DETECTED   Barbiturates NONE DETECTED  NONE DETECTED  URINALYSIS, ROUTINE W REFLEX MICROSCOPIC     Status: Abnormal   Collection Time    07/16/13  8:40 AM      Result Value Range   Color, Urine YELLOW  YELLOW   APPearance CLEAR  CLEAR   Specific Gravity, Urine 1.020  1.005 - 1.030   pH 6.0  5.0 - 8.0   Glucose, UA NEGATIVE  NEGATIVE mg/dL   Hgb urine dipstick LARGE (*) NEGATIVE   Bilirubin Urine NEGATIVE  NEGATIVE   Ketones, ur 40 (*) NEGATIVE mg/dL   Protein, ur NEGATIVE  NEGATIVE mg/dL   Urobilinogen, UA 0.2  0.0 - 1.0 mg/dL   Nitrite NEGATIVE  NEGATIVE   Leukocytes, UA NEGATIVE  NEGATIVE  URINE MICROSCOPIC-ADD ON     Status: None   Collection Time    07/16/13  8:40 AM      Result Value Range   Squamous Epithelial / LPF RARE  RARE   RBC / HPF 21-50  <3 RBC/hpf  CBC     Status: Abnormal   Collection Time    07/16/13  8:41 AM      Result Value Range   WBC 11.8 (*) 4.0 - 10.5  K/uL   RBC 3.63 (*) 3.87 - 5.11 MIL/uL   Hemoglobin 10.7 (*) 12.0 - 15.0 g/dL   HCT 16.1 (*) 09.6 - 04.5 %   MCV 84.0  78.0 - 100.0 fL   MCH 29.5  26.0 - 34.0 pg   MCHC 35.1  30.0 - 36.0 g/dL   RDW 40.9  81.1 - 91.4 %   Platelets 325  150 - 400 K/uL  HCG, QUANTITATIVE, PREGNANCY     Status: Abnormal   Collection Time    07/16/13  8:41 AM      Result Value Range   hCG, Beta Chain, [place] Longest [address] (*) <5 mIU/mL  ABO/RH     Status: None   Collection Time    07/16/13  8:41 AM      Result Value Range   ABO/RH(D) A POS      Urine drug screen positive for cocaine    MAU Course  Procedures  MDM  Spoke with Dr Debroah Loop who advised CBC, Quant, ABORh, UDS and bedside ultrasound  Assessment and Plan   A: Threatened miscarriage  P: Miscarriage precautions given. Advised no drugs, no intercourse Return to MAU on Friday for reevaluation or if things get worse Rest, fluids   Carolynn Serve 07/16/2013, 10:43 AM

## 2013-07-16 NOTE — Progress Notes (Signed)
Patient unwilling to have pelvic exam. Moderate-large amount of blood on pad. Passed medium sized clot.  Patient flailing about. Yelling, wanting to know what is going on. "is my baby OK?"  Discontinued attempt to do pelvic. Patient cleaned up. To BR to void. No active bleeding at this time. Ernst Spell, NP discussing plan of care. Attempting to comfort patient. Patient given phone to call family member.

## 2013-07-16 NOTE — Progress Notes (Signed)
Patient very restless, tossing and turning in bed. Then will fall sound asleep. Difficult to interview.

## 2013-07-18 ENCOUNTER — Encounter (HOSPITAL_COMMUNITY): Payer: Self-pay

## 2013-07-18 ENCOUNTER — Inpatient Hospital Stay (HOSPITAL_COMMUNITY)
Admission: AD | Admit: 2013-07-18 | Discharge: 2013-07-18 | Disposition: A | Discharge status: Home / Self Care | Payer: Medicaid Other | Source: Ambulatory Visit | Attending: Obstetrics and Gynecology | Admitting: Obstetrics and Gynecology

## 2013-07-18 DIAGNOSIS — F1920 Other psychoactive substance dependence, uncomplicated: Secondary | ICD-10-CM

## 2013-07-18 DIAGNOSIS — O26859 Spotting complicating pregnancy, unspecified trimester: Secondary | ICD-10-CM

## 2013-07-18 DIAGNOSIS — O209 Hemorrhage in early pregnancy, unspecified: Secondary | ICD-10-CM | POA: Insufficient documentation

## 2013-07-18 DIAGNOSIS — O99320 Drug use complicating pregnancy, unspecified trimester: Secondary | ICD-10-CM

## 2013-07-18 LAB — CBC
HCT: 30.3 % — ABNORMAL LOW (ref 36.0–46.0)
Hemoglobin: 10.2 g/dL — ABNORMAL LOW (ref 12.0–15.0)
MCHC: 33.7 g/dL (ref 30.0–36.0)
MCV: 85.1 fL (ref 78.0–100.0)
Platelets: 312 10*3/uL (ref 150–400)
RBC: 3.56 MIL/uL — ABNORMAL LOW (ref 3.87–5.11)
RDW: 13.3 % (ref 11.5–15.5)

## 2013-07-18 MED ORDER — ACETAMINOPHEN 500 MG PO TABS
1000.0000 mg | ORAL_TABLET | Freq: Once | ORAL | Status: AC
  Administered 2013-07-18: 1000 mg via ORAL
  Filled 2013-07-18: qty 2

## 2013-07-18 NOTE — MAU Note (Signed)
Patient states she was told to follow up today for a threatened SAB. States she has continued to bleed but is heavier today, no pain

## 2013-07-18 NOTE — Progress Notes (Signed)
CSW received consult to see patient for substance abuse during pregnancy.  CSW is familiar with this patient from previous pregnancies.  CSW attempted to meet with patient and asked patient if she remembered meeting with CSW in the past.  She had the covers over her head and quickly pulled them down to look at CSW.  She replied, "Yup.  I don't want to talk" and pulled the covers back over her head.  CSW informed her that CSW is available to talk about substance abuse treatment options.  She again declined.  CSW wrote contact number on patient's board and asked her to call if she changes her mind.  She agreed and thanked CSW.

## 2013-07-18 NOTE — MAU Provider Note (Signed)
History     CSN: [id number]  Arrival date and time: 07/18/13 0944   First Provider Initiated Contact with Patient 07/18/13 1018      Chief Complaint  Patient presents with  . Follow-up   HPI  Ms. Regina Coleman is a 27 y.o. female [id number] [redacted]w[redacted]d who presents essentially for a follow up visit. She was here on Oct 15th for vaginal bleeding; US showed a known placenta previa. She was told to come back today for reevaluation. Today the bleeding is less, on her pad there is a small amount of brown blood. She is currently not having any pain. No intercourse recently. She has 8 other children whom she does not have custody of. She is planning to have her tubes tied following this delivery. She is an active cocaine user; last use was Oct 13th. She is requesting a social work consult to help her find sobriety. No prenatal care started; clinic referral requested by patient.    OB History   Grav Para Term Preterm Abortions TAB SAB Ect Mult Living   11 8 8  0 2 0 2 0 0 8      Past Medical History  Diagnosis Date  . Genital herpes     Also Chlamydia, GC, HPV and Trich  . Mental disorder   . Bipolar 2 disorder     pt says r/o bipolar.  . ADHD (attention deficit hyperactivity disorder)   . Abnormal Pap smear     during preg, will retest after  . Abnormal Pap smear and cervical HPV (human papillomavirus)   . Anemia   . Depression   . Anxiety   . Cocaine abuse     Past Surgical History  Procedure Laterality Date  . No past surgeries      Family History  Problem Relation Age of Onset  . Diabetes Paternal Grandmother   . Heart disease Paternal Grandmother   . Hypertension Paternal Grandmother   . Other Neg Hx     History  Substance Use Topics  . Smoking status: Never Smoker   . Smokeless tobacco: Never Used  . Alcohol Use: No    Allergies: No Known Allergies  No prescriptions prior to admission   Results for orders placed during the hospital encounter of 07/18/13  (from the past 24 hour(s))  CBC     Status: Abnormal   Collection Time    07/18/13 10:30 AM      Result Value Range   WBC 11.3 (*) 4.0 - 10.5 K/uL   RBC 3.56 (*) 3.87 - 5.11 MIL/uL   Hemoglobin 10.2 (*) 12.0 - 15.0 g/dL   HCT 47.8 (*) 29.5 - 62.1 %   MCV 85.1  78.0 - 100.0 fL   MCH 28.7  26.0 - 34.0 pg   MCHC 33.7  30.0 - 36.0 g/dL   RDW 30.8  65.7 - 84.6 %   Platelets 312  150 - 400 K/uL   Review of Systems  Constitutional: Negative for fever and chills.  Gastrointestinal: Negative for nausea, vomiting, abdominal pain, diarrhea and constipation.  Genitourinary: Negative for dysuria, urgency, frequency and hematuria.       No vaginal discharge. + vaginal bleeding; small amount, brown  No dysuria.   Neurological: Negative for headaches.   Physical Exam   Blood pressure 114/70, pulse 86, temperature 98.4 F (36.9 C), temperature source Oral, resp. rate 16, height 5\' 4"  (1.626 m), weight 157 lb 9.6 oz (71.487 kg), SpO2 99.00%. Fetal heart tones  by doppler 151 bpm   Physical Exam  Constitutional: She is oriented to person, place, and time. She appears well-developed and well-nourished. No distress.  Neck: Neck supple.  Respiratory: Effort normal.  GI: Soft. She exhibits no distension. There is no tenderness. There is no rebound and no guarding.  Neurological: She is alert and oriented to person, place, and time.  Impulsive Jittery   Skin: Skin is warm. She is not diaphoretic.  Psychiatric: Her mood appears anxious. Her speech is rapid and/or pressured. She is hyperactive. She expresses impulsivity.    MAU Course  Procedures None  MDM UA +fht CBC  Social work consult Social work came to speak to the patient and patient refused to talk to the social worker at this time Patient request's tylenol for her toothache that she rates 7/10.  Tylenol 1 gram ordered    Consulted with Dr. Jolayne Panther regarding plan of care. Pt to follow up in the clinic Contacted MFM to discuss  [place] results; MD will return my call when available.   Assessment and Plan  A: Marginal previa vs. Marginal abruption  Cervical length 2.3 cm Vaginal bleeding in pregnancy + fetal heart tones  P: Discharge home Bleeding precautions discussed Pelvic rest discussed Return to MAU if bleeding worsens Follow up in the clininc; referral made, they will call you to schedule appointment   [facility], JENNIFER IRENE FNP-C 07/18/2013, 1:19 PM

## 2013-07-18 NOTE — MAU Note (Signed)
Pt states last drug use was 07/14/2013

## 2013-07-21 ENCOUNTER — Telehealth: Payer: Self-pay | Admitting: Obstetrics & Gynecology

## 2013-07-21 NOTE — Telephone Encounter (Signed)
Called first number in patients demographics. However, the person who answered the phone requested her number be removed because Sylva no longer lives there. I called the other number listed, and it was not on. Will send certified letter.

## 2013-07-21 NOTE — MAU Provider Note (Signed)
Attestation of Attending Supervision of Advanced Practitioner (CNM/NP): Evaluation and management procedures were performed by the Advanced Practitioner under my supervision and collaboration.  I have reviewed the Advanced Practitioner's note and chart, and I agree with the management and plan.  Evaristo Tsuda 07/21/2013 2:40 PM

## 2013-07-23 ENCOUNTER — Encounter: Payer: Self-pay | Admitting: Obstetrics & Gynecology

## 2013-07-24 ENCOUNTER — Encounter: Payer: Self-pay | Admitting: *Deleted

## 2013-07-24 ENCOUNTER — Encounter: Payer: Medicaid Other | Admitting: Obstetrics & Gynecology

## 2013-07-26 ENCOUNTER — Encounter (HOSPITAL_COMMUNITY)
Admission: AD | Disposition: A | Discharge status: Home / Self Care | Payer: Self-pay | Source: Ambulatory Visit | Attending: Obstetrics & Gynecology

## 2013-07-26 ENCOUNTER — Observation Stay (HOSPITAL_COMMUNITY)
Admission: AD | Admit: 2013-07-26 | Discharge: 2013-07-27 | Disposition: A | Discharge status: Home / Self Care | Payer: Medicaid Other | Source: Ambulatory Visit | Attending: Obstetrics & Gynecology | Admitting: Obstetrics & Gynecology

## 2013-07-26 ENCOUNTER — Encounter (HOSPITAL_COMMUNITY): Payer: Medicaid Other | Admitting: Anesthesiology

## 2013-07-26 ENCOUNTER — Observation Stay (HOSPITAL_COMMUNITY): Payer: Medicaid Other | Admitting: Anesthesiology

## 2013-07-26 ENCOUNTER — Inpatient Hospital Stay (HOSPITAL_COMMUNITY): Payer: Medicaid Other

## 2013-07-26 ENCOUNTER — Observation Stay (HOSPITAL_COMMUNITY): Payer: Medicaid Other

## 2013-07-26 ENCOUNTER — Encounter (HOSPITAL_COMMUNITY): Payer: Self-pay | Admitting: Anesthesiology

## 2013-07-26 DIAGNOSIS — F1420 Cocaine dependence, uncomplicated: Secondary | ICD-10-CM

## 2013-07-26 DIAGNOSIS — IMO0002 Reserved for concepts with insufficient information to code with codable children: Secondary | ICD-10-CM

## 2013-07-26 DIAGNOSIS — O4692 Antepartum hemorrhage, unspecified, second trimester: Secondary | ICD-10-CM

## 2013-07-26 DIAGNOSIS — O021 Missed abortion: Principal | ICD-10-CM

## 2013-07-26 DIAGNOSIS — K0889 Other specified disorders of teeth and supporting structures: Secondary | ICD-10-CM

## 2013-07-26 DIAGNOSIS — F1920 Other psychoactive substance dependence, uncomplicated: Secondary | ICD-10-CM

## 2013-07-26 DIAGNOSIS — O4590 Premature separation of placenta, unspecified, unspecified trimester: Secondary | ICD-10-CM

## 2013-07-26 DIAGNOSIS — Z7251 High risk heterosexual behavior: Secondary | ICD-10-CM

## 2013-07-26 DIAGNOSIS — O4690 Antepartum hemorrhage, unspecified, unspecified trimester: Secondary | ICD-10-CM

## 2013-07-26 DIAGNOSIS — O99320 Drug use complicating pregnancy, unspecified trimester: Secondary | ICD-10-CM | POA: Diagnosis present

## 2013-07-26 DIAGNOSIS — O4412 Placenta previa with hemorrhage, second trimester: Secondary | ICD-10-CM

## 2013-07-26 DIAGNOSIS — O99322 Drug use complicating pregnancy, second trimester: Secondary | ICD-10-CM

## 2013-07-26 DIAGNOSIS — O4592 Premature separation of placenta, unspecified, second trimester: Secondary | ICD-10-CM | POA: Diagnosis present

## 2013-07-26 HISTORY — PX: DILATION AND EVACUATION: SHX1459

## 2013-07-26 LAB — RPR: RPR Ser Ql: NONREACTIVE

## 2013-07-26 LAB — URINALYSIS, ROUTINE W REFLEX MICROSCOPIC
Bilirubin Urine: NEGATIVE
Ketones, ur: 40 mg/dL — AB
Leukocytes, UA: NEGATIVE
Protein, ur: NEGATIVE mg/dL
Specific Gravity, Urine: >1.03 — ABNORMAL HIGH (ref 1.005–1.030)
Urobilinogen, UA: 0.2 mg/dL (ref 0.0–1.0)

## 2013-07-26 LAB — PREPARE RBC (CROSSMATCH)

## 2013-07-26 LAB — CBC
HCT: 26.1 % — ABNORMAL LOW (ref 36.0–46.0)
HCT: 19.3 % — ABNORMAL LOW (ref 36.0–46.0)
Hemoglobin: 8.8 g/dL — ABNORMAL LOW (ref 12.0–15.0)
MCH: 28.9 pg (ref 26.0–34.0)
MCH: 29.4 pg (ref 26.0–34.0)
MCHC: 33.2 g/dL (ref 30.0–36.0)
Platelets: 383 10*3/uL (ref 150–400)
RBC: 3.04 MIL/uL — ABNORMAL LOW (ref 3.87–5.11)
RDW: 12.8 % (ref 11.5–15.5)
WBC: 12.2 10*3/uL — ABNORMAL HIGH (ref 4.0–10.5)
WBC: 12.8 10*3/uL — ABNORMAL HIGH (ref 4.0–10.5)

## 2013-07-26 LAB — RAPID HIV SCREEN (WH-MAU): Rapid HIV Screen: NONREACTIVE

## 2013-07-26 LAB — URINE MICROSCOPIC-ADD ON

## 2013-07-26 LAB — RAPID URINE DRUG SCREEN, HOSP PERFORMED
Barbiturates: NOT DETECTED
Benzodiazepines: NOT DETECTED
Cocaine: POSITIVE — AB
Opiates: NOT DETECTED

## 2013-07-26 SURGERY — DILATION AND EVACUATION, UTERUS, SECOND TRIMESTER
Anesthesia: General | Site: Uterus | Wound class: Clean Contaminated

## 2013-07-26 MED ORDER — LORAZEPAM 2 MG/ML IJ SOLN
1.0000 mg | Freq: Once | INTRAMUSCULAR | Status: AC
  Administered 2013-07-26: 1 mg via INTRAVENOUS

## 2013-07-26 MED ORDER — LIDOCAINE HCL 1 % IJ SOLN
INTRAMUSCULAR | Status: DC | PRN
  Administered 2013-07-26: 20 mL

## 2013-07-26 MED ORDER — OXYTOCIN BOLUS FROM INFUSION: 500.0000 mL | INTRAVENOUS | Status: DC

## 2013-07-26 MED ORDER — LIDOCAINE HCL (PF) 1 % IJ SOLN: 30.0000 mL | INTRAMUSCULAR | Status: DC | PRN

## 2013-07-26 MED ORDER — SODIUM CHLORIDE 0.9 % IV SOLN
INTRAVENOUS | Status: DC
  Administered 2013-07-26: 50 mL/h via INTRAVENOUS

## 2013-07-26 MED ORDER — LORAZEPAM 2 MG/ML IJ SOLN: 1.0000 mg | INTRAMUSCULAR | Status: DC | PRN

## 2013-07-26 MED ORDER — PHENYLEPHRINE 40 MCG/ML (10ML) SYRINGE FOR IV PUSH (FOR BLOOD PRESSURE SUPPORT)
PREFILLED_SYRINGE | INTRAVENOUS | Status: AC
  Filled 2013-07-26: qty 10

## 2013-07-26 MED ORDER — LACTATED RINGERS IV SOLN: INTRAVENOUS | Status: DC

## 2013-07-26 MED ORDER — HYDROMORPHONE HCL PF 1 MG/ML IJ SOLN: 0.2000 mg | INTRAMUSCULAR | Status: DC | PRN

## 2013-07-26 MED ORDER — MIDAZOLAM HCL 2 MG/2ML IJ SOLN
INTRAMUSCULAR | Status: DC | PRN
  Administered 2013-07-26 (×2): 1 mg via INTRAVENOUS

## 2013-07-26 MED ORDER — PROPOFOL 10 MG/ML IV EMUL
INTRAVENOUS | Status: DC | PRN
  Administered 2013-07-26: 100 mg via INTRAVENOUS

## 2013-07-26 MED ORDER — HYDROMORPHONE HCL PF 1 MG/ML IJ SOLN: 0.2500 mg | INTRAMUSCULAR | Status: DC | PRN

## 2013-07-26 MED ORDER — MIDAZOLAM HCL 2 MG/2ML IJ SOLN
INTRAMUSCULAR | Status: AC
  Filled 2013-07-26: qty 2

## 2013-07-26 MED ORDER — PHENYLEPHRINE 40 MCG/ML (10ML) SYRINGE FOR IV PUSH (FOR BLOOD PRESSURE SUPPORT)
PREFILLED_SYRINGE | INTRAVENOUS | Status: AC
  Filled 2013-07-26: qty 5

## 2013-07-26 MED ORDER — LIDOCAINE HCL (CARDIAC) 20 MG/ML IV SOLN
INTRAVENOUS | Status: DC | PRN
  Administered 2013-07-26: 40 mg via INTRAVENOUS

## 2013-07-26 MED ORDER — MEPERIDINE HCL 25 MG/ML IJ SOLN: 6.2500 mg | INTRAMUSCULAR | Status: DC | PRN

## 2013-07-26 MED ORDER — BENZOCAINE 10 % MT GEL
Freq: Four times a day (QID) | OROMUCOSAL | Status: DC | PRN
  Administered 2013-07-26: 16:00:00 via OROMUCOSAL
  Filled 2013-07-26 (×2): qty 7

## 2013-07-26 MED ORDER — LIDOCAINE HCL 1 % IJ SOLN
INTRAMUSCULAR | Status: AC
  Filled 2013-07-26: qty 20

## 2013-07-26 MED ORDER — MIDAZOLAM HCL 2 MG/2ML IJ SOLN: 0.5000 mg | Freq: Once | INTRAMUSCULAR | Status: DC | PRN

## 2013-07-26 MED ORDER — FLEET ENEMA 7-19 GM/118ML RE ENEM: 1.0000 | ENEMA | RECTAL | Status: DC | PRN

## 2013-07-26 MED ORDER — FERROUS SULFATE 325 (65 FE) MG PO TABS
325.0000 mg | ORAL_TABLET | Freq: Two times a day (BID) | ORAL | Status: DC
  Administered 2013-07-27 (×2): 325 mg via ORAL
  Filled 2013-07-26 (×2): qty 1

## 2013-07-26 MED ORDER — DOXYCYCLINE HYCLATE 100 MG IV SOLR
200.0000 mg | Freq: Once | INTRAVENOUS | Status: AC
  Administered 2013-07-26: 200 mg via INTRAVENOUS
  Filled 2013-07-26: qty 200

## 2013-07-26 MED ORDER — MISOPROSTOL 200 MCG PO TABS
ORAL_TABLET | ORAL | Status: AC
  Filled 2013-07-26: qty 4

## 2013-07-26 MED ORDER — CITRIC ACID-SODIUM CITRATE 334-500 MG/5ML PO SOLN: 30.0000 mL | ORAL | Status: DC | PRN

## 2013-07-26 MED ORDER — ONDANSETRON HCL 4 MG/2ML IJ SOLN
INTRAMUSCULAR | Status: DC | PRN
  Administered 2013-07-26: 4 mg via INTRAVENOUS

## 2013-07-26 MED ORDER — ONDANSETRON HCL 4 MG/2ML IJ SOLN: 4.0000 mg | Freq: Four times a day (QID) | INTRAMUSCULAR | Status: DC | PRN

## 2013-07-26 MED ORDER — KETAMINE HCL 10 MG/ML IJ SOLN
INTRAMUSCULAR | Status: AC
  Filled 2013-07-26: qty 1

## 2013-07-26 MED ORDER — HYDROMORPHONE HCL PF 1 MG/ML IJ SOLN
1.0000 mg | Freq: Once | INTRAMUSCULAR | Status: AC
  Administered 2013-07-26: 1 mg via INTRAVENOUS
  Filled 2013-07-26: qty 1

## 2013-07-26 MED ORDER — MISOPROSTOL 200 MCG PO TABS: 800.0000 ug | ORAL_TABLET | ORAL | Status: DC

## 2013-07-26 MED ORDER — ACETAMINOPHEN 500 MG PO TABS: 1000.0000 mg | ORAL_TABLET | Freq: Once | ORAL | Status: DC

## 2013-07-26 MED ORDER — FENTANYL CITRATE 0.05 MG/ML IJ SOLN: 50.0000 ug | INTRAMUSCULAR | Status: DC | PRN

## 2013-07-26 MED ORDER — OXYTOCIN 40 UNITS IN LACTATED RINGERS INFUSION - SIMPLE MED: 62.5000 mL/h | INTRAVENOUS | Status: DC

## 2013-07-26 MED ORDER — OXYCODONE-ACETAMINOPHEN 5-325 MG PO TABS: 1.0000 | ORAL_TABLET | ORAL | Status: DC | PRN

## 2013-07-26 MED ORDER — AMOXICILLIN-POT CLAVULANATE 875-125 MG PO TABS
1.0000 | ORAL_TABLET | Freq: Two times a day (BID) | ORAL | Status: DC
  Administered 2013-07-27 (×3): 1 via ORAL
  Filled 2013-07-26 (×4): qty 1

## 2013-07-26 MED ORDER — ONDANSETRON HCL 4 MG/2ML IJ SOLN
INTRAMUSCULAR | Status: AC
  Filled 2013-07-26: qty 2

## 2013-07-26 MED ORDER — IBUPROFEN 600 MG PO TABS
600.0000 mg | ORAL_TABLET | Freq: Four times a day (QID) | ORAL | Status: DC | PRN
  Administered 2013-07-27 (×2): 600 mg via ORAL
  Filled 2013-07-26 (×2): qty 1

## 2013-07-26 MED ORDER — LACTATED RINGERS IV SOLN
INTRAVENOUS | Status: DC | PRN
  Administered 2013-07-26 (×2): via INTRAVENOUS

## 2013-07-26 MED ORDER — SIMETHICONE 80 MG PO CHEW: 80.0000 mg | CHEWABLE_TABLET | Freq: Four times a day (QID) | ORAL | Status: DC | PRN

## 2013-07-26 MED ORDER — DOCUSATE SODIUM 100 MG PO CAPS
100.0000 mg | ORAL_CAPSULE | Freq: Two times a day (BID) | ORAL | Status: DC
  Administered 2013-07-27 (×2): 100 mg via ORAL
  Filled 2013-07-26 (×2): qty 1

## 2013-07-26 MED ORDER — DIPHENHYDRAMINE HCL 25 MG PO CAPS
25.0000 mg | ORAL_CAPSULE | Freq: Once | ORAL | Status: DC
  Filled 2013-07-26: qty 1

## 2013-07-26 MED ORDER — ACETAMINOPHEN 325 MG PO TABS: 650.0000 mg | ORAL_TABLET | ORAL | Status: DC | PRN

## 2013-07-26 MED ORDER — FENTANYL CITRATE 0.05 MG/ML IJ SOLN
INTRAMUSCULAR | Status: AC
  Filled 2013-07-26: qty 2

## 2013-07-26 MED ORDER — FENTANYL CITRATE 0.05 MG/ML IJ SOLN
INTRAMUSCULAR | Status: AC
  Administered 2013-07-26: 100 ug
  Filled 2013-07-26: qty 2

## 2013-07-26 MED ORDER — KETOROLAC TROMETHAMINE 30 MG/ML IJ SOLN: 30.0000 mg | Freq: Once | INTRAMUSCULAR | Status: DC

## 2013-07-26 MED ORDER — FENTANYL CITRATE 0.05 MG/ML IJ SOLN
INTRAMUSCULAR | Status: DC | PRN
  Administered 2013-07-26 (×4): 50 ug via INTRAVENOUS

## 2013-07-26 MED ORDER — PHENYLEPHRINE HCL 10 MG/ML IJ SOLN: INTRAMUSCULAR | Status: DC | PRN

## 2013-07-26 MED ORDER — MISOPROSTOL 100 MCG PO TABS
ORAL_TABLET | ORAL | Status: DC | PRN
  Administered 2013-07-26: 800 ug via RECTAL

## 2013-07-26 MED ORDER — LACTATED RINGERS IV SOLN
500.0000 mL | INTRAVENOUS | Status: DC | PRN
  Administered 2013-07-26: 17:00:00 via INTRAVENOUS

## 2013-07-26 MED ORDER — FENTANYL CITRATE 0.05 MG/ML IJ SOLN: 100.0000 ug | Freq: Once | INTRAMUSCULAR | Status: DC

## 2013-07-26 MED ORDER — PROMETHAZINE HCL 25 MG/ML IJ SOLN: 6.2500 mg | INTRAMUSCULAR | Status: DC | PRN

## 2013-07-26 MED ORDER — PANTOPRAZOLE SODIUM 40 MG PO TBEC
40.0000 mg | DELAYED_RELEASE_TABLET | Freq: Every day | ORAL | Status: DC
  Administered 2013-07-27 (×2): 40 mg via ORAL
  Filled 2013-07-26 (×3): qty 1

## 2013-07-26 MED ORDER — IBUPROFEN 600 MG PO TABS: 600.0000 mg | ORAL_TABLET | Freq: Four times a day (QID) | ORAL | Status: DC | PRN

## 2013-07-26 MED ORDER — PROMETHAZINE HCL 25 MG/ML IJ SOLN: 12.5000 mg | Freq: Once | INTRAMUSCULAR | Status: DC

## 2013-07-26 MED ORDER — LORAZEPAM 2 MG/ML IJ SOLN
INTRAMUSCULAR | Status: AC
  Filled 2013-07-26: qty 1

## 2013-07-26 MED ORDER — ONDANSETRON HCL 4 MG PO TABS: 4.0000 mg | ORAL_TABLET | Freq: Four times a day (QID) | ORAL | Status: DC | PRN

## 2013-07-26 MED ORDER — MENTHOL 3 MG MT LOZG: 1.0000 | LOZENGE | OROMUCOSAL | Status: DC | PRN

## 2013-07-26 MED ORDER — PHENYLEPHRINE HCL 10 MG/ML IJ SOLN
INTRAMUSCULAR | Status: DC | PRN
  Administered 2013-07-26 (×2): 40 ug via INTRAVENOUS
  Administered 2013-07-26 (×2): 80 ug via INTRAVENOUS

## 2013-07-26 MED ORDER — 0.9 % SODIUM CHLORIDE (POUR BTL) OPTIME
TOPICAL | Status: DC | PRN
  Administered 2013-07-26: 1000 mL

## 2013-07-26 MED ORDER — LACTATED RINGERS IV SOLN
INTRAVENOUS | Status: DC
  Administered 2013-07-27: 02:00:00 via INTRAVENOUS

## 2013-07-26 SURGICAL SUPPLY — 37 items
CATH ROBINSON RED A/P 16FR (CATHETERS) ×2 IMPLANT
CLAMP CORD UMBIL (MISCELLANEOUS) IMPLANT
CLOTH BEACON ORANGE TIMEOUT ST (SAFETY) ×3 IMPLANT
DRAPE LG THREE QUARTER DISP (DRAPES) ×2 IMPLANT
DRSG OPSITE POSTOP 4X10 (GAUZE/BANDAGES/DRESSINGS) ×1 IMPLANT
DURAPREP 26ML APPLICATOR (WOUND CARE) ×1 IMPLANT
ELECT REM PT RETURN 9FT ADLT (ELECTROSURGICAL)
ELECTRODE REM PT RTRN 9FT ADLT (ELECTROSURGICAL) ×1 IMPLANT
EXTRACTOR VACUUM M CUP 4 TUBE (SUCTIONS) IMPLANT
GLOVE BIO SURGEON STRL SZ7 (GLOVE) ×3 IMPLANT
GLOVE BIOGEL PI IND STRL 7.0 (GLOVE) ×2 IMPLANT
GLOVE BIOGEL PI INDICATOR 7.0 (GLOVE) ×1
GOWN PREVENTION PLUS XLARGE (GOWN DISPOSABLE) ×3 IMPLANT
GOWN STRL REIN XL XLG (GOWN DISPOSABLE) ×3 IMPLANT
KIT ABG SYR 3ML LUER SLIP (SYRINGE) IMPLANT
NDL HYPO 25X5/8 SAFETYGLIDE (NEEDLE) ×1 IMPLANT
NEEDLE HYPO 22GX1.5 SAFETY (NEEDLE) ×3 IMPLANT
NEEDLE HYPO 25X5/8 SAFETYGLIDE (NEEDLE) ×3 IMPLANT
NS IRRIG 1000ML POUR BTL (IV SOLUTION) ×3 IMPLANT
PACK C SECTION WH (CUSTOM PROCEDURE TRAY) ×1 IMPLANT
PACK VAGINAL MINOR WOMEN LF (CUSTOM PROCEDURE TRAY) ×2 IMPLANT
PAD ABD 7.5X8 STRL (GAUZE/BANDAGES/DRESSINGS) ×1 IMPLANT
PAD OB MATERNITY 4.3X12.25 (PERSONAL CARE ITEMS) ×3 IMPLANT
RTRCTR C-SECT PINK 25CM LRG (MISCELLANEOUS) IMPLANT
SET BERKELEY SUCTION TUBING (SUCTIONS) ×2 IMPLANT
STAPLER VISISTAT 35W (STAPLE) IMPLANT
SUT PDS AB 0 CT1 27 (SUTURE) IMPLANT
SUT PDS AB 0 CTX 36 PDP370T (SUTURE) IMPLANT
SUT PLAIN 2 0 XLH (SUTURE) IMPLANT
SUT VIC AB 0 CT1 36 (SUTURE) ×2 IMPLANT
SUT VIC AB 0 CTX 36 (SUTURE)
SUT VIC AB 0 CTX36XBRD ANBCTRL (SUTURE) ×2 IMPLANT
SUT VIC AB 4-0 KS 27 (SUTURE) ×1 IMPLANT
SYR 30ML LL (SYRINGE) ×1 IMPLANT
TOWEL OR 17X24 6PK STRL BLUE (TOWEL DISPOSABLE) ×3 IMPLANT
TRAY FOLEY CATH 14FR (SET/KITS/TRAYS/PACK) ×1 IMPLANT
WATER STERILE IRR 1000ML POUR (IV SOLUTION) ×3 IMPLANT

## 2013-07-26 NOTE — Anesthesia Postprocedure Evaluation (Signed)
Anesthesia Post Note  Patient: Regina Coleman  Procedure(s) Performed: Procedure(s) (LRB): DILATATION AND EVACUATION (D&E) 2ND TRIMESTER. with ultrasound (N/A)  Anesthesia type: General  Patient location: PACU  Post pain: Pain level controlled  Post assessment: Post-op Vital signs reviewed  Last Vitals:  Filed Vitals:   07/26/13 1830  BP: 123/74  Pulse: 72  Temp: 36.1 C  Resp: 28    Post vital signs: Reviewed  Level of consciousness: sedated  Complications: No apparent anesthesia complications

## 2013-07-26 NOTE — H&P (Signed)
Attestation of Attending Supervision of Advanced Practitioner (PA/CNM/NP): Evaluation and management procedures were performed by the Advanced Practitioner under my supervision and collaboration.  I have reviewed the Advanced Practitioner's note and chart, and I agree with the management and plan.  Patient was evaluated on L&D, she is bleeding significantly.  EBL  > 1000 ml, large clots noted.   Patient adamantly refused pelvic examinations and demands Oragel for tooth pain.  She is cursing a lot, and is very verbally abusive to me and the nursing staff.  She is very combative and screaming a lot; Ativan 2 mg IV x 1 given to patient. Oragel was obtained and given to patient.   Patient is not able to give consent. Talked to patient's aunt, Arma Heading over the phone, who gave medical consent to take patient to the OR for D&E given bleeding.   She is type and crossed for two units.  Anesthesologist on call (Dr. Arby Barrette) notified.  Will go to OR when ready.   Jaynie Collins, MD, FACOG Attending Obstetrician & Gynecologist Faculty Practice, [facility]

## 2013-07-26 NOTE — Progress Notes (Signed)
Faculty Practice OB/GYN Attending Note  Patient's Hgb is now 6.4 from admission value 8.8, and this is very concerning.  Ordered to be transfused with 2 units of pRBCs.  Patient will be observed overnight, will recheck Hgb in am.  Routine postoperative care.  Jaynie Collins, MD, FACOG Attending Obstetrician & Gynecologist Faculty Practice, [facility] [place]

## 2013-07-26 NOTE — Progress Notes (Signed)
Pt seen in PACU. Alert but drowsy. Oriented x 3. Verbal and written consent for blood transfusion obtained with Dwain Sarna, RN of PACU, and myself. Pt verbalized understanding. Affect anxious, angry, and tearful

## 2013-07-26 NOTE — Progress Notes (Signed)
Pt in room 161, uncooperative and combative, thrashing in bed, unable to assess pt.

## 2013-07-26 NOTE — Transfer of Care (Signed)
Immediate Anesthesia Transfer of Care Note  Patient: Regina Coleman  Procedure(s) Performed: Procedure(s): DILATATION AND EVACUATION (D&E) 2ND TRIMESTER. with ultrasound (N/A)  Patient Location: PACU  Anesthesia Type:General  Level of Consciousness: sedated  Airway & Oxygen Therapy: Patient Spontanous Breathing and Patient connected to face mask oxygen  Post-op Assessment: Report given to PACU RN and Post -op Vital signs reviewed and stable  Post vital signs: Reviewed and stable  Complications: No apparent anesthesia complications

## 2013-07-26 NOTE — Progress Notes (Signed)
Pt transferred to  OR.

## 2013-07-26 NOTE — H&P (Signed)
Regina Coleman is a 27 y.o. female presenting for bleeding and pain.  She presented via EMS with severe abdominal pain and vaginal bleeding, to the extent of hemorrhage.  EMS states the FOB arrived at the same time and told them the patient had just ingested crack cocaine. Patient was combative, argumentative, and crying out in pain.  While being undressed she saw her blood-stained clothes and began to panic.  Her clothes were covered in blood.  Patient would not allow [place] to auscultate for fetal heart tones, or do a speculum or digital exam.  Demanded something for her abdominal and tooth pain.   Maternal Medical History:  Reason for admission: Contractions and vaginal bleeding.   Contractions: Onset was 1-2 hours ago.   Frequency: irregular.   Perceived severity is moderate.    Fetal activity: Perceived fetal activity is none.    Prenatal complications: Bleeding and placental abnormality.     OB History   Grav Para Term Preterm Abortions TAB SAB Ect Mult Living   11 8 8  0 2 0 2 0 0 8     Past Medical History  Diagnosis Date  . Genital herpes     Also Chlamydia, GC, HPV and Trich  . Mental disorder   . Bipolar 2 disorder     pt says r/o bipolar.  . ADHD (attention deficit hyperactivity disorder)   . Abnormal Pap smear     during preg, will retest after  . Abnormal Pap smear and cervical HPV (human papillomavirus)   . Anemia   . Depression   . Anxiety   . Cocaine abuse    Past Surgical History  Procedure Laterality Date  . No past surgeries     Family History: family history includes Diabetes in her paternal grandmother; Heart disease in her paternal grandmother; Hypertension in her paternal grandmother. There is no history of Other. Social History:  reports that she has never smoked. She has never used smokeless tobacco. She reports that she uses illicit drugs ("Crack" cocaine). She reports that she does not drink alcohol.   Review of Systems  Constitutional: Negative  for fever and chills.  HENT:       Severe tooth pain.  "I have bad teeth, holes in all my teeth and they hurt!"  Gastrointestinal: Positive for abdominal pain.  Genitourinary:       Vaginal bleeding   Neurological: Negative for headaches.      Blood pressure 147/105, pulse 88, temperature 98.2 F (36.8 C), temperature source Oral, resp. rate 20, SpO2 100.00%. Maternal Exam:  Uterine Assessment: Contraction strength is moderate.  Contraction frequency is irregular.   Abdomen: Patient reports the following abdominal tenderness: suprapubic.  Fundal height is 17.   Fetal presentation: no presenting part  Introitus: Normal vulva. Vagina is positive for vaginal discharge (profuse blood).  Ferning test: not done.  Nitrazine test: not done. Amniotic fluid character: not assessed.  Cervix: not evaluated.   Fetal Exam Fetal Monitor Review: Mode: hand-held doppler probe.   Baseline rate: unable to hear.      Physical Exam  Constitutional: She appears well-developed. She appears distressed (combative, refuses to be examined, does consent to ultrasound).  Cardiovascular: Normal rate.   Respiratory: Effort normal.  GI: She exhibits no distension. There is tenderness (suprapubic) in the suprapubic area. There is guarding. There is no rebound.  Genitourinary: Vaginal discharge (profuse blood) found.  Musculoskeletal: Normal range of motion.  Neurological: She is alert.  Agitated  Psychiatric:  Admits to crack use, but states none today, last use yesterday    Prenatal labs: ABO, Rh: --/--/A POS (10/25 1513) Antibody: NEG (10/25 1513) Rubella: 0.86 (02/08 2151) RPR: NON REACTIVE (02/25 0535)  HBsAg: NEGATIVE (02/08 2151)  HIV: NON REACTIVE (02/08 2151)  GBS:     Bedside US showed no fetal heart rate, baby is in cervix.  >> awaiting formal interpretation  Results for orders placed during the hospital encounter of 07/26/13 (from the past 72 hour(s))  TYPE AND SCREEN     Status:  None   Collection Time    07/26/13  3:13 PM      Result Value Range   ABO/RH(D) A POS     Antibody Screen NEG     Sample Expiration 07/29/2013    CBC     Status: Abnormal   Collection Time    07/26/13  3:13 PM      Result Value Range   WBC 12.2 (*) 4.0 - 10.5 K/uL   RBC 3.04 (*) 3.87 - 5.11 MIL/uL   Hemoglobin 8.8 (*) 12.0 - 15.0 g/dL   HCT 16.1 (*) 09.6 - 04.5 %   MCV 85.9  78.0 - 100.0 fL   MCH 28.9  26.0 - 34.0 pg   MCHC 33.7  30.0 - 36.0 g/dL   RDW 40.9  81.1 - 91.4 %   Platelets 383  150 - 400 K/uL  PREPARE RBC (CROSSMATCH)     Status: None   Collection Time    07/26/13  4:40 PM      Result Value Range   Order Confirmation ORDER PROCESSED BY BLOOD BANK      Assessment/Plan: A:  SIUP at [redacted]w[redacted]d       Fetal demise      Antepartum hemorrhage      Advanced cervical dilation      Crack use today  P:  Discussed with Dr Macon Large      Admit to [facility] (AICU full)      Cytotec PO q 4 hrs (pt will not allow cervical exam)      NPO for possible D&E  [facility] 07/26/2013, 4:16 PM

## 2013-07-26 NOTE — MAU Note (Signed)
27 yo, G11P8 at [redacted]w[redacted]d, presents to MAU via EMS with c/o vaginal bleeding. States she has been bleeding since 10/15 and reports crack cocaine use yesterday.

## 2013-07-26 NOTE — Anesthesia Preprocedure Evaluation (Signed)
Anesthesia Evaluation  Patient identified by MRN, date of birth, ID band Patient awake    Reviewed: Allergy & Precautions, H&P , NPO status , Patient's Chart, lab work & pertinent test results  Airway Mallampati: II TM Distance: >3 FB Neck ROM: full    Dental  (+) Teeth Intact and Poor Dentition   Pulmonary neg pulmonary ROS,    Pulmonary exam normal       Cardiovascular hypertension, negative cardio ROS  Rhythm:Regular Rate:Tachycardia  HTN secondary to acute cocaine use   Neuro/Psych negative neurological ROS     GI/Hepatic negative GI ROS, Neg liver ROS,   Endo/Other  negative endocrine ROS  Renal/GU negative Renal ROS  negative genitourinary   Musculoskeletal   Abdominal Normal abdominal exam  (+)   Peds  Hematology negative hematology ROS (+)   Anesthesia Other Findings   Reproductive/Obstetrics negative OB ROS                           Anesthesia Physical Anesthesia Plan  ASA: II and emergent  Anesthesia Plan: General   Post-op Pain Management:    Induction: Intravenous  Airway Management Planned: LMA  Additional Equipment:   Intra-op Plan:   Post-operative Plan:   Informed Consent: I have reviewed the patients History and Physical, chart, labs and discussed the procedure including the risks, benefits and alternatives for the proposed anesthesia with the patient or authorized representative who has indicated his/her understanding and acceptance.     Plan Discussed with: CRNA and Surgeon  Anesthesia Plan Comments:         Anesthesia Quick Evaluation

## 2013-07-26 NOTE — Progress Notes (Signed)
Large gush of blood and large clot noted on pad, Dr. Macon Large called and will come and evaluate

## 2013-07-26 NOTE — Op Note (Signed)
Regina Coleman PROCEDURE DATE: 07/26/2013  PREOPERATIVE DIAGNOSIS: 17 week intrauterine fetal demise, placental abruption with significant bleeding, crack cocaine use POSTOPERATIVE DIAGNOSIS: The same PROCEDURE:     Dilation and Evacuation under ultrasound guidance SURGEON:  Dr. Jaynie Collins  INDICATIONS: 27 y.o. [id number] with17 week intrauterine fetal demise, placental abruption with significant bleeding, crack cocaine use who needs surgical completion.  Please see preoperative note for more details.  Verbal consent was obtained from patient's aunt for this urgent case.  FINDINGS:  A 17 week size uterus, decomposing 17 week fetus and placenta.  Specimens sent to pathology.  ANESTHESIA:    General LMA, paracervical block. INTRAVENOUS FLUIDS:  1000 ml of LR ESTIMATED BLOOD LOSS:  300 ml ( lost about 1 L of blood prior to surgery) SPECIMENS:  Products of conception sent to pathology COMPLICATIONS:  None immediate.  PROCEDURE DETAILS:  The patient was then taken to the operating room where general analgesia was administered and was found to be adequate.  After an adequate timeout was performed, she was placed in the dorsal lithotomy position and examined; then prepped and draped in the sterile manner.   Her bladder was catheterized for an unmeasured amount of clear, yellow urine. A vaginal speculum was then placed in the patient's vagina and the demised fetus was noted within the amnionitic membranes at the cervix. There were large clots also evacuated.  The fetus was delivered and the umbilical cord was cut.  Of note, there was a very foul odor emanating from the demised fetus, it looked like the fetus has been demised for some time as it showed signs of decomposition.   A single tooth tenaculum was applied to the anterior lip of the cervix.  A paracervical block using 20 ml of 1 % Lidocaine was administered. The 14 mm suction curette that was gently advanced to the uterine fundus.  The suction  device was then activated and curette slowly rotated to clear the uterus of products of conception under ultrasound guidance.   Forceps were also introduced to obtain fragments of the placenta.  A sharp curettage was then performed to confirm complete emptying of the uterus. There was small amount bleeding noted and the tenaculum removed with good hemostasis noted.   All instruments were removed from the patient's vagina. Cytotec 800 mcg was placed per rectum. The patient tolerated the procedure well and was taken to the recovery area awake, and in stable condition.   Jaynie Collins, MD, FACOG Attending Obstetrician & Gynecologist Faculty Practice, [facility] [place]

## 2013-07-27 LAB — TYPE AND SCREEN
Antibody Screen: NEGATIVE
Unit division: 0
Unit division: 0

## 2013-07-27 LAB — CBC
HCT: 22.2 % — ABNORMAL LOW (ref 36.0–46.0)
Hemoglobin: 7.5 g/dL — ABNORMAL LOW (ref 12.0–15.0)
MCHC: 33.8 g/dL (ref 30.0–36.0)
MCV: 84.7 fL (ref 78.0–100.0)
Platelets: 287 10*3/uL (ref 150–400)
RBC: 2.62 MIL/uL — ABNORMAL LOW (ref 3.87–5.11)
WBC: 8.7 10*3/uL (ref 4.0–10.5)

## 2013-07-27 LAB — HEPATITIS C ANTIBODY: HCV Ab: NEGATIVE

## 2013-07-27 MED ORDER — AMOXICILLIN-POT CLAVULANATE 875-125 MG PO TABS: 1.0000 | ORAL_TABLET | Freq: Two times a day (BID) | ORAL | Status: DC

## 2013-07-27 MED ORDER — IBUPROFEN 800 MG PO TABS: 800.0000 mg | ORAL_TABLET | Freq: Three times a day (TID) | ORAL | Status: DC | PRN

## 2013-07-27 MED ORDER — LORAZEPAM 1 MG PO TABS: 1.0000 mg | ORAL_TABLET | Freq: Four times a day (QID) | ORAL | Status: DC | PRN

## 2013-07-27 MED ORDER — FERROUS SULFATE 325 (65 FE) MG PO TABS: 325.0000 mg | ORAL_TABLET | Freq: Two times a day (BID) | ORAL | Status: AC

## 2013-07-27 MED ORDER — MEDROXYPROGESTERONE ACETATE 150 MG/ML IM SUSP
150.0000 mg | Freq: Once | INTRAMUSCULAR | Status: AC
  Administered 2013-07-27: 150 mg via INTRAMUSCULAR
  Filled 2013-07-27: qty 1

## 2013-07-27 NOTE — Progress Notes (Signed)
1 Day Post-Op Procedure(s) (LRB): DILATATION AND EVACUATION (D&E) 2ND TRIMESTER. with ultrasound guidance  Subjective: Patient reports tolerating PO and no problems voiding.  Tolerated transfusion overnight.  Hgb this morning is 7.4; the 6.6 value in PACU was probably an overestimate at that time as her hemoglobin had not equilibrated by then.  No anemia symptoms, ambulating without difficulty. She is calm this morning, apologized for her behavior yesterday. She still complains of tooth pain but reports that it is improved from yesterday. Of note, patient was started on Augmentin yesterday for a presumed tooth infection/abscess.  She also received Doxycycline 200 mg IV x 1 for D&E infection prophylaxis.  Patient wants to go home, claims she will go to her grandmother's house if discharged. Her friend/FOB at her bedside feels this is not true and maintains that the patient is homeless and will go back to the streets to use crack. He reports that patient had recent suicidal attempts; he wants her evaluated by Psych. Patient has had [facility] admissions in the past.  This morning, the patient adamantly denies any suicidal or homicidal ideation; and has no plans or desires to hurt herself or others in any way.   Objective: I have reviewed patient's vital signs, intake and output, medications and labs.  BP 100/59  Pulse 61  Temp(Src) 98.3 F (36.8 C) (Oral)  Resp 16  SpO2 100% General: alert and no distress Resp: clear to auscultation bilaterally Cardio: regular rate and rhythm GI: soft, non-tender; bowel sounds normal; no masses,  no organomegaly Extremities: extremities normal, atraumatic, no cyanosis or edema and Homans sign is negative, no sign of DVT Vaginal Bleeding: minimal  Assessment: s/p Procedure(s): DILATATION AND EVACUATION (D&E) 2ND TRIMESTER. with ultrasound (N/A): stable, progressing well, tolerating diet and anemia. Also has social issues.  Plan: Regular diet Encourage  ambulation Oral medications for pain Ferrous sulfate ordered for asymptomatic anemia; s/p transfusion of two units of pRBCs Discontinue IV fluids and IV access Continue Augmentin for her tooth infection; contiue Oragel Will follow up SW recommendations and discharge later today if remains stable    LOS: 1 day    Tereso Newcomer, MD 07/27/2013, 7:38 AM

## 2013-07-27 NOTE — Progress Notes (Signed)
Acknowledged MD order for social work consult.   Patient admitted for placenta abruption and fetal demise at 17 weeks.  Patient has extensive hx of crack cocaine dependence.  Met with patient who was initially dismissive.   She later became more engaging.  Patient spoke of the pregnancy loss and became tearful as she spoke of how the loss was so unexpected.  She was very fidgety during social work visit.  She admits to long hx of dependence on crack cocaine.  She denies use of any other illicit drug.  She has 8 children and 6 are being cared for by her aunt, one is in the father's custody and one in foster care.  Spoke with her regarding treatment.  She replied that she has considered treatment in the past, but there is always a wait to get into treatment and she usually return to using before she's admitted.  She agreed to treatment if she could be placed prior to the aunt's expected time of arrival around 4 or 5pm to pick her up.   Patient informed that there is always an admission process and it usually takes several hours and sometimes days to be approved for admission.  Patient informed of the unlikelihood of being placed in a program prior to 5pm.  Contacted [facility], [facility], [facility], and [facility].  Left message or was told to call back on Monday.  Patient does not appear ready to commit to treatment.  She noted that CSW Johnnette Barrios was very supportive of her in the past and if she decides to seek treatment, she will call [place] tomorrow.  Provided her with contact card for [facility].   Re-visited patient and discussed efforts to find treatment facility.  Provided her with names and contact numbers for agencies contacted, and encouraged her to follow up with them regarding treatment.  She denies any SI or HI. Informed that she plans to stay with her grandmother who lives at [address] in [place].  Plan is for aunt Arma Heading [phone] to pick her up and take her to grandmother's home.  Case discussed  with RN caring for patient. Lisaanne Lawrie J, LCSW

## 2013-07-27 NOTE — Discharge Summary (Signed)
Physician Discharge Summary  Patient ID: Regina Coleman MRN: [id number] DOB/AGE: [birth year] 27 y.o.  Admit date: 07/26/2013 Discharge date: 07/27/2013  Admission Diagnoses: 17 week fetal demise, hemorrhage, cocaine addiction  Discharge Diagnoses:  Principal Problem:   Placental abruption in second trimester Active Problems:   Cocaine dependence complicating pregnancy   High risk sexual behavior   Fetal demise at 17 weeks   Cocaine dependence   Discharged Condition: good  Hospital Course: She was admitted and underwent a d&c and transfusion of 2 units of PRBCs for her anemia and significant blood loss. By POD #1 she was ambulating, tolerating po, voiding, and ready for discharge. A social work consult was obtained prior to discharge home with her grandmother. She denies HI and SI. She says that she wants permanent sterility. She had signed Medicaid PPS forms in the past but changed her mind post partum because she did not want to use a spinal for anesthesia. She requested a depo provera injection prior to discharge, and this request was complied with. She signed another [facility] PPS form today and was scheduled for her laparoscopic bilateral salpingectomy with Dr. Marice Potter on 07-31-13. She was started on augmentin for a tooth abscess.  Consults: social work  Significant Diagnostic Studies: labs, + cocaine on UDS  Treatments: surgery: as above  Discharge Exam: Blood pressure 100/59, pulse 61, temperature 98.3 F (36.8 C), temperature source Oral, resp. rate 16, SpO2 100.00%. General appearance: alert Cardio: regular rate and rhythm, S1, S2 normal, no murmur, click, rub or gallop GI: soft, non-tender; bowel sounds normal; no masses,  no organomegaly  Disposition: 01-Home or Self Care with her grandmother     Medication List         amoxicillin-clavulanate 875-125 MG per tablet  Commonly known as:  AUGMENTIN  Take 1 tablet by mouth every 12 (twelve) hours.     ferrous sulfate 325  (65 FE) MG tablet  Take 1 tablet (325 mg total) by mouth 2 (two) times daily with a meal.     ibuprofen 800 MG tablet  Commonly known as:  ADVIL,MOTRIN  Take 1 tablet (800 mg total) by mouth every 8 (eight) hours as needed for pain.           Follow-up Information   Follow up with [facility]. Schedule an appointment as soon as possible for a visit in 7 weeks. (post op visit)    Contact information:   [address] [place] [address] [phone]      Signed: Randee Upchurch C. 07/27/2013, 11:24 AM

## 2013-07-27 NOTE — Progress Notes (Signed)
1400:  Pt waiting for ride home.  Have reviewed D/C instructions with her. I wanted to reinforce    Dr. Ellin Saba preprocedure instructions scheduled for next Thursday with the pt.   I  have been unable to acquire the     "Preprocedure Instructions" paperwork   for the pt.  I called [facility], OR, and then let Dr. Marice Potter know.  She thought it was a good idea, but when I told her I couldn't get the paperwork she assured me '[place] would take care of it on Monday. i informed Dr. Marice Potter that 'Debbie in OR felt like the pt would need a pre-op appointment, but Dr. Marice Potter stated that it was not necessary.  I will get a current phone # from the pt, so they can contact her. Cristie Hem RN

## 2013-07-27 NOTE — Anesthesia Postprocedure Evaluation (Signed)
  Anesthesia Post-op Note  Patient: Regina Coleman  Procedure(s) Performed: Procedure(s): DILATATION AND EVACUATION (D&E) 2ND TRIMESTER. with ultrasound (N/A)  Patient Location: [facility]  Anesthesia Type:General  Level of Consciousness: awake and alert   Airway and Oxygen Therapy: Patient Spontanous Breathing  Post-op Pain: mild  Post-op Assessment: Patient's Cardiovascular Status Stable, Respiratory Function Stable, No signs of Nausea or vomiting, Pain level controlled, No headache, No residual numbness and No residual motor weakness  Post-op Vital Signs: stable  Complications: No apparent anesthesia complications

## 2013-07-28 ENCOUNTER — Encounter (HOSPITAL_COMMUNITY): Payer: Self-pay | Admitting: Obstetrics & Gynecology

## 2013-07-28 ENCOUNTER — Encounter (HOSPITAL_COMMUNITY): Payer: Self-pay | Admitting: Pharmacist

## 2013-07-28 LAB — URINE CULTURE
Colony Count: NO GROWTH
Special Requests: NORMAL

## 2013-07-28 LAB — GC/CHLAMYDIA PROBE AMP
CT Probe RNA: NEGATIVE
GC Probe RNA: NEGATIVE

## 2013-07-29 ENCOUNTER — Emergency Department (HOSPITAL_COMMUNITY)
Admission: EM | Admit: 2013-07-29 | Discharge: 2013-07-30 | Disposition: A | Discharge status: Home / Self Care | Payer: Medicaid Other | Attending: Emergency Medicine | Admitting: Emergency Medicine

## 2013-07-29 ENCOUNTER — Encounter (HOSPITAL_COMMUNITY): Payer: Self-pay | Admitting: Emergency Medicine

## 2013-07-29 DIAGNOSIS — F3189 Other bipolar disorder: Secondary | ICD-10-CM | POA: Insufficient documentation

## 2013-07-29 DIAGNOSIS — X789XXA Intentional self-harm by unspecified sharp object, initial encounter: Secondary | ICD-10-CM | POA: Insufficient documentation

## 2013-07-29 DIAGNOSIS — R443 Hallucinations, unspecified: Secondary | ICD-10-CM | POA: Insufficient documentation

## 2013-07-29 DIAGNOSIS — Z8742 Personal history of other diseases of the female genital tract: Secondary | ICD-10-CM | POA: Insufficient documentation

## 2013-07-29 DIAGNOSIS — D649 Anemia, unspecified: Secondary | ICD-10-CM | POA: Insufficient documentation

## 2013-07-29 DIAGNOSIS — F909 Attention-deficit hyperactivity disorder, unspecified type: Secondary | ICD-10-CM | POA: Insufficient documentation

## 2013-07-29 DIAGNOSIS — F411 Generalized anxiety disorder: Secondary | ICD-10-CM | POA: Insufficient documentation

## 2013-07-29 DIAGNOSIS — F1410 Cocaine abuse, uncomplicated: Secondary | ICD-10-CM | POA: Insufficient documentation

## 2013-07-29 DIAGNOSIS — F329 Major depressive disorder, single episode, unspecified: Secondary | ICD-10-CM

## 2013-07-29 DIAGNOSIS — Z3202 Encounter for pregnancy test, result negative: Secondary | ICD-10-CM | POA: Insufficient documentation

## 2013-07-29 DIAGNOSIS — F3289 Other specified depressive episodes: Secondary | ICD-10-CM | POA: Insufficient documentation

## 2013-07-29 DIAGNOSIS — Z8619 Personal history of other infectious and parasitic diseases: Secondary | ICD-10-CM | POA: Insufficient documentation

## 2013-07-29 LAB — RAPID URINE DRUG SCREEN, HOSP PERFORMED
Amphetamines: NOT DETECTED
Barbiturates: NOT DETECTED
Benzodiazepines: NOT DETECTED
Cocaine: POSITIVE — AB
Opiates: NOT DETECTED
Tetrahydrocannabinol: NOT DETECTED

## 2013-07-29 LAB — COMPREHENSIVE METABOLIC PANEL
ALT: 12 U/L (ref 0–35)
AST: 18 U/L (ref 0–37)
Albumin: 2.7 g/dL — ABNORMAL LOW (ref 3.5–5.2)
Alkaline Phosphatase: 61 U/L (ref 39–117)
BUN: 7 mg/dL (ref 6–23)
CO2: 24 mEq/L (ref 19–32)
Calcium: 8.9 mg/dL (ref 8.4–10.5)
Chloride: 105 mEq/L (ref 96–112)
Creatinine, Ser: 0.56 mg/dL (ref 0.50–1.10)
GFR calc Af Amer: >90 mL/min (ref >90)
GFR calc non Af Amer: >90 mL/min (ref >90)
Glucose, Bld: 87 mg/dL (ref 70–99)
Potassium: 4 mEq/L (ref 3.5–5.1)
Sodium: 137 mEq/L (ref 135–145)
Total Bilirubin: <0.1 mg/dL — ABNORMAL LOW (ref 0.3–1.2)
Total Protein: 6.2 g/dL (ref 6.0–8.3)

## 2013-07-29 LAB — CBC
HCT: 25.4 % — ABNORMAL LOW (ref 36.0–46.0)
Hemoglobin: 8.6 g/dL — ABNORMAL LOW (ref 12.0–15.0)
MCH: 29.5 pg (ref 26.0–34.0)
MCHC: 33.9 g/dL (ref 30.0–36.0)
MCV: 87 fL (ref 78.0–100.0)
Platelets: 365 10*3/uL (ref 150–400)
RBC: 2.92 MIL/uL — ABNORMAL LOW (ref 3.87–5.11)
RDW: 14.8 % (ref 11.5–15.5)
WBC: 7.4 10*3/uL (ref 4.0–10.5)

## 2013-07-29 LAB — SALICYLATE LEVEL: Salicylate Lvl: <2 mg/dL — ABNORMAL LOW (ref 2.8–20.0)

## 2013-07-29 LAB — ETHANOL: Alcohol, Ethyl (B): <11 mg/dL (ref 0–11)

## 2013-07-29 LAB — RUBELLA SCREEN: Rubella: 1.28 Index — ABNORMAL HIGH (ref <0.90)

## 2013-07-29 LAB — POCT PREGNANCY, URINE: Preg Test, Ur: POSITIVE — AB

## 2013-07-29 LAB — ACETAMINOPHEN LEVEL: Acetaminophen (Tylenol), Serum: <15 ug/mL (ref 10–30)

## 2013-07-29 MED ORDER — LORAZEPAM 1 MG PO TABS: 1.0000 mg | ORAL_TABLET | Freq: Three times a day (TID) | ORAL | Status: DC | PRN

## 2013-07-29 MED ORDER — ACETAMINOPHEN 325 MG PO TABS
650.0000 mg | ORAL_TABLET | ORAL | Status: DC | PRN
  Administered 2013-07-29 – 2013-07-30 (×4): 650 mg via ORAL
  Filled 2013-07-29 (×4): qty 2

## 2013-07-29 MED ORDER — ZOLPIDEM TARTRATE 5 MG PO TABS: 5.0000 mg | ORAL_TABLET | Freq: Every evening | ORAL | Status: DC | PRN

## 2013-07-29 MED ORDER — ONDANSETRON HCL 4 MG PO TABS
4.0000 mg | ORAL_TABLET | Freq: Three times a day (TID) | ORAL | Status: DC | PRN
Start: 2013-07-29 — End: 2013-07-30

## 2013-07-29 MED ORDER — FLUOXETINE HCL 10 MG PO CAPS
10.0000 mg | ORAL_CAPSULE | Freq: Every day | ORAL | Status: AC
  Administered 2013-07-29 – 2013-07-30 (×2): 10 mg via ORAL
  Filled 2013-07-29 (×2): qty 1

## 2013-07-29 NOTE — Progress Notes (Signed)
   CARE MANAGEMENT ED NOTE 07/29/2013  Patient:  Regina Coleman, Regina Coleman   Account Number:  [id number]  Date Initiated:  07/29/2013  Documentation initiated by:  Edd Arbour  Subjective/Objective Assessment:   27 yr old female medicaid of [place] pt states she does not have a pcp agreed to receive a [place]     Subjective/Objective Assessment Detail:   CM noted medicaid card in epic shows no pcp listed     Action/Plan:   cm spoke with pt and provided iwth a list of [place] [employer]   Action/Plan Detail:   Anticipated DC Date:       Status Recommendation to Physician:   Result of Recommendation:    Other ED Services  Consult Working Psychologist, educational  Other  Outpatient Services - Pt will follow up    Choice offered to / List presented to:            Status of service:  Completed, signed off  ED Comments:   ED Comments Detail:

## 2013-07-29 NOTE — ED Provider Notes (Signed)
CSN: [id number]     Arrival date & time 07/29/13  0915 History   First MD Initiated Contact with Patient 07/29/13 [phone]     Chief Complaint  Patient presents with  . Medical Clearance   (Consider location/radiation/quality/duration/timing/severity/associated sxs/prior Treatment) Patient is a 27 y.o. female presenting with mental health disorder. The history is provided by the patient. No language interpreter was used.  Mental Health Problem Presenting symptoms: depression, hallucinations and self mutilation   Associated symptoms comment:  She is here for depression after miscarriage that occurred on 07/26/13. She feels responsible for the miscarriage because of her cocaine habit. She is having auditory hallucinations, hearing babies cry, and cutting her arm to feel the pain. She denies SI.   Past Medical History  Diagnosis Date  . Genital herpes     Also Chlamydia, GC, HPV and Trich  . Mental disorder   . Bipolar 2 disorder     pt says r/o bipolar.  . ADHD (attention deficit hyperactivity disorder)   . Abnormal Pap smear     during preg, will retest after  . Abnormal Pap smear and cervical HPV (human papillomavirus)   . Anemia   . Depression   . Anxiety   . Cocaine abuse    Past Surgical History  Procedure Laterality Date  . No past surgeries    . Dilation and evacuation N/A 07/26/2013    Procedure: DILATATION AND EVACUATION (D&E) 2ND TRIMESTER. with ultrasound;  Surgeon: Tereso Newcomer, MD;  Location: [facility];  Service: Obstetrics;  Laterality: N/A;  . Dnc     Family History  Problem Relation Age of Onset  . Diabetes Paternal Grandmother   . Heart disease Paternal Grandmother   . Hypertension Paternal Grandmother   . Other Neg Hx    History  Substance Use Topics  . Smoking status: Never Smoker   . Smokeless tobacco: Never Used  . Alcohol Use: No   OB History   Grav Para Term Preterm Abortions TAB SAB Ect Mult Living   11 8 8  0 2 0 2 0 0 8     Review of Systems   Constitutional: Negative for fever and chills.  HENT: Negative.   Respiratory: Negative.   Cardiovascular: Negative.   Gastrointestinal: Negative.   Genitourinary:       She is still having mild vaginal bleeding since the miscarriage that continues to improve.   Musculoskeletal: Negative.   Skin: Negative.   Neurological: Negative.   Psychiatric/Behavioral: Positive for hallucinations, self-injury and dysphoric mood.    Allergies  Review of patient's allergies indicates no known allergies.  Home Medications   Current Outpatient Rx  Name  Route  Sig  Dispense  Refill  . acetaminophen (TYLENOL) 325 MG tablet   Oral   Take 650 mg by mouth every 6 (six) hours as needed for pain.         Marland Kitchen amoxicillin-clavulanate (AUGMENTIN) 875-125 MG per tablet   Oral   Take 1 tablet by mouth every 12 (twelve) hours.   14 tablet   0   . ferrous sulfate 325 (65 FE) MG tablet   Oral   Take 1 tablet (325 mg total) by mouth 2 (two) times daily with a meal.   60 tablet   3   . ibuprofen (ADVIL,MOTRIN) 800 MG tablet   Oral   Take 1 tablet (800 mg total) by mouth every 8 (eight) hours as needed for pain.   60 tablet   2  BP 117/60  Pulse 80  Temp(Src) 98.9 F (37.2 C) (Oral)  Resp 18  SpO2 100%  Breastfeeding? Unknown Physical Exam  Constitutional: She is oriented to person, place, and time. She appears well-developed and well-nourished.  HENT:  Head: Normocephalic.  Neck: Normal range of motion. Neck supple.  Cardiovascular: Normal rate and regular rhythm.   Pulmonary/Chest: Effort normal and breath sounds normal.  Abdominal: Soft. Bowel sounds are normal. There is no tenderness. There is no rebound and no guarding.  Musculoskeletal: Normal range of motion.  Neurological: She is alert and oriented to person, place, and time.  Skin: Skin is warm and dry. No rash noted.  Psychiatric: She has a normal mood and affect.    ED Course  Procedures (including critical care  time) Labs Review Labs Reviewed  CBC - Abnormal; Notable for the following:    RBC 2.92 (*)    Hemoglobin 8.6 (*)    HCT 25.4 (*)    All other components within normal limits  COMPREHENSIVE METABOLIC PANEL - Abnormal; Notable for the following:    Albumin 2.7 (*)    Total Bilirubin <0.1 (*)    All other components within normal limits  SALICYLATE LEVEL - Abnormal; Notable for the following:    Salicylate Lvl <2.0 (*)    All other components within normal limits  URINE RAPID DRUG SCREEN (HOSP PERFORMED) - Abnormal; Notable for the following:    Cocaine POSITIVE (*)    All other components within normal limits  POCT PREGNANCY, URINE - Abnormal; Notable for the following:    Preg Test, Ur POSITIVE (*)    All other components within normal limits  ACETAMINOPHEN LEVEL  ETHANOL   Results for orders placed during the hospital encounter of 07/29/13  ACETAMINOPHEN LEVEL      Result Value Range   Acetaminophen (Tylenol), Serum <15.0  10 - 30 ug/mL  CBC      Result Value Range   WBC 7.4  4.0 - 10.5 K/uL   RBC 2.92 (*) 3.87 - 5.11 MIL/uL   Hemoglobin 8.6 (*) 12.0 - 15.0 g/dL   HCT 16.1 (*) 09.6 - 04.5 %   MCV 87.0  78.0 - 100.0 fL   MCH 29.5  26.0 - 34.0 pg   MCHC 33.9  30.0 - 36.0 g/dL   RDW 40.9  81.1 - 91.4 %   Platelets 365  150 - 400 K/uL  COMPREHENSIVE METABOLIC PANEL      Result Value Range   Sodium 137  135 - 145 mEq/L   Potassium 4.0  3.5 - 5.1 mEq/L   Chloride 105  96 - 112 mEq/L   CO2 24  19 - 32 mEq/L   Glucose, Bld 87  70 - 99 mg/dL   BUN 7  6 - 23 mg/dL   Creatinine, Ser 7.82  0.50 - 1.10 mg/dL   Calcium 8.9  8.4 - 95.6 mg/dL   Total Protein 6.2  6.0 - 8.3 g/dL   Albumin 2.7 (*) 3.5 - 5.2 g/dL   AST 18  0 - 37 U/L   ALT 12  0 - 35 U/L   Alkaline Phosphatase 61  39 - 117 U/L   Total Bilirubin <0.1 (*) 0.3 - 1.2 mg/dL   GFR calc non Af Amer >90  >90 mL/min   GFR calc Af Amer >90  >90 mL/min  ETHANOL      Result Value Range   Alcohol, Ethyl (B) <11  0 - 11  mg/dL  SALICYLATE LEVEL      Result Value Range   Salicylate Lvl <2.0 (*) 2.8 - 20.0 mg/dL  URINE RAPID DRUG SCREEN (HOSP PERFORMED)      Result Value Range   Opiates NONE DETECTED  NONE DETECTED   Cocaine POSITIVE (*) NONE DETECTED   Benzodiazepines NONE DETECTED  NONE DETECTED   Amphetamines NONE DETECTED  NONE DETECTED   Tetrahydrocannabinol NONE DETECTED  NONE DETECTED   Barbiturates NONE DETECTED  NONE DETECTED  POCT PREGNANCY, URINE      Result Value Range   Preg Test, Ur POSITIVE (*) NEGATIVE    Imaging Review No results found.  EKG Interpretation   None       Date: 07/29/2013  Rate: 64  Rhythm: normal sinus rhythm  QRS Axis: normal  Intervals: normal  ST/T Wave abnormalities: nonspecific ST changes  Conduction Disutrbances:none  Narrative Interpretation:   Old EKG Reviewed: none available   MDM  No diagnosis found. 1. Depression 2. Auditory hallucinations  Pregnancy test positive due to recent proximity to miscarriage (07/26/13). Hemoglobin is 8.6 which is improved since last level of 7.5 - feel she is stable. Medically cleared for [facility] evaluation.     Arnoldo Hooker, PA-C 07/29/13 1221

## 2013-07-29 NOTE — ED Provider Notes (Signed)
Medical screening examination/treatment/procedure(s) were performed by non-physician practitioner and as supervising physician I was immediately available for consultation/collaboration.  EKG Interpretation   None        Raeford Razor, MD 07/29/13 1706

## 2013-07-29 NOTE — Consult Note (Signed)
[facility] Face-to-Face Psychiatry Consult   Reason for Consult:  Hearing babies crying after her miscarriage with depression and scratching her arms Referring Physician:  ER MD   Regina Coleman is an 27 y.o. female.  Assessment: AXIS I:  major depression recurrent moderate,cocaine abuse AXIS II:  Deferred AXIS III:   Past Medical History  Diagnosis Date  . Genital herpes     Also Chlamydia, GC, HPV and Trich  . Mental disorder   . Bipolar 2 disorder     pt says r/o bipolar.  . ADHD (attention deficit hyperactivity disorder)   . Abnormal Pap smear     during preg, will retest after  . Abnormal Pap smear and cervical HPV (human papillomavirus)   . Anemia   . Depression   . Anxiety   . Cocaine abuse    AXIS IV:  economic problems, housing problems and has 8 children and just lost the ninth one after 4 months AXIS V:  51-60 moderate symptoms  Plan:  No evidence of imminent risk to self or others at present.    Subjective:   Regina Coleman is a 27 y.o. female patient admitted with depression after the loss of her baby to miscarriage.  HPI:  Regina Coleman says she had a miscarriage after 4 months gestation and she is depressed and guilty because she was using cocaine during the pregnancy.  She is homeless and all her children are taken care of by other people.  Says she just cannot stop using cocaine.  She has been depressed all along but worse after the miscarriage.  She came in because she has been hearing a baby crying at night knowing there are no babies in the house.  She does not normally have hallucinations, she says.  She has been crying and making scratches on her arm to relieve stress but does not want to kill herself.  She plans to have a tubal ligation on 30 October. Says she has been feeling guilty about using during the pregnancy. HPI Elements:   Location:  ER. Quality:  hearing babies crying and cutting herself. Severity:  moderate. Timing:  just had a miscarriage 25  oct. Duration:  chronic depression and cocaine use. Context:  miscarriage.  Past Psychiatric History: Past Medical History  Diagnosis Date  . Genital herpes     Also Chlamydia, GC, HPV and Trich  . Mental disorder   . Bipolar 2 disorder     pt says r/o bipolar.  . ADHD (attention deficit hyperactivity disorder)   . Abnormal Pap smear     during preg, will retest after  . Abnormal Pap smear and cervical HPV (human papillomavirus)   . Anemia   . Depression   . Anxiety   . Cocaine abuse     reports that she has never smoked. She has never used smokeless tobacco. She reports that she uses illicit drugs ("Crack" cocaine and Cocaine). She reports that she does not drink alcohol. Family History  Problem Relation Age of Onset  . Diabetes Paternal Grandmother   . Heart disease Paternal Grandmother   . Hypertension Paternal Grandmother   . Other Neg Hx            Allergies:  No Known Allergies  ACT Assessment Complete:  Yes:    Educational Status    Risk to Self: Risk to self Is patient at risk for suicide?: No, but patient needs Medical Clearance Substance abuse history and/or treatment for substance abuse?: Yes  Risk to Others:    Abuse:    Prior Inpatient Therapy:    Prior Outpatient Therapy:    Additional Information:                    Objective: Blood pressure 117/74, pulse 83, temperature 98.8 F (37.1 C), temperature source Oral, resp. rate 24, SpO2 100.00%, unknown if currently breastfeeding.There is no weight on file to calculate BMI. Results for orders placed during the hospital encounter of 07/29/13 (from the past 72 hour(s))  URINE RAPID DRUG SCREEN (HOSP PERFORMED)     Status: Abnormal   Collection Time    07/29/13  9:41 AM      Result Value Range   Opiates NONE DETECTED  NONE DETECTED   Cocaine POSITIVE (*) NONE DETECTED   Benzodiazepines NONE DETECTED  NONE DETECTED   Amphetamines NONE DETECTED  NONE DETECTED   Tetrahydrocannabinol NONE  DETECTED  NONE DETECTED   Barbiturates NONE DETECTED  NONE DETECTED   Comment:            DRUG SCREEN FOR MEDICAL PURPOSES     ONLY.  IF CONFIRMATION IS NEEDED     FOR ANY PURPOSE, NOTIFY LAB     WITHIN 5 DAYS.                LOWEST DETECTABLE LIMITS     FOR URINE DRUG SCREEN     Drug Class       Cutoff (ng/mL)     Amphetamine      1000     Barbiturate      200     Benzodiazepine   200     Tricyclics       300     Opiates          300     Cocaine          300     THC              50  POCT PREGNANCY, URINE     Status: Abnormal   Collection Time    07/29/13  9:45 AM      Result Value Range   Preg Test, Ur POSITIVE (*) NEGATIVE   Comment:            THE SENSITIVITY OF THIS     METHODOLOGY IS >24 mIU/mL  ACETAMINOPHEN LEVEL     Status: None   Collection Time    07/29/13 10:05 AM      Result Value Range   Acetaminophen (Tylenol), Serum <15.0  10 - 30 ug/mL   Comment:            THERAPEUTIC CONCENTRATIONS VARY     SIGNIFICANTLY. A RANGE OF 10-30     ug/mL MAY BE AN EFFECTIVE     CONCENTRATION FOR MANY PATIENTS.     HOWEVER, SOME ARE BEST TREATED     AT CONCENTRATIONS OUTSIDE THIS     RANGE.     ACETAMINOPHEN CONCENTRATIONS     >150 ug/mL AT 4 HOURS AFTER     INGESTION AND >50 ug/mL AT 12     HOURS AFTER INGESTION ARE     OFTEN ASSOCIATED WITH TOXIC     REACTIONS.  CBC     Status: Abnormal   Collection Time    07/29/13 10:05 AM      Result Value Range   WBC 7.4  4.0 - 10.5 K/uL   RBC 2.92 (*) 3.87 -  5.11 MIL/uL   Hemoglobin 8.6 (*) 12.0 - 15.0 g/dL   HCT 16.1 (*) 09.6 - 04.5 %   MCV 87.0  78.0 - 100.0 fL   MCH 29.5  26.0 - 34.0 pg   MCHC 33.9  30.0 - 36.0 g/dL   RDW 40.9  81.1 - 91.4 %   Platelets 365  150 - 400 K/uL  COMPREHENSIVE METABOLIC PANEL     Status: Abnormal   Collection Time    07/29/13 10:05 AM      Result Value Range   Sodium 137  135 - 145 mEq/L   Potassium 4.0  3.5 - 5.1 mEq/L   Chloride 105  96 - 112 mEq/L   CO2 24  19 - 32 mEq/L   Glucose,  Bld 87  70 - 99 mg/dL   BUN 7  6 - 23 mg/dL   Creatinine, Ser 7.82  0.50 - 1.10 mg/dL   Calcium 8.9  8.4 - 95.6 mg/dL   Total Protein 6.2  6.0 - 8.3 g/dL   Albumin 2.7 (*) 3.5 - 5.2 g/dL   AST 18  0 - 37 U/L   ALT 12  0 - 35 U/L   Alkaline Phosphatase 61  39 - 117 U/L   Total Bilirubin <0.1 (*) 0.3 - 1.2 mg/dL   GFR calc non Af Amer >90  >90 mL/min   GFR calc Af Amer >90  >90 mL/min   Comment: (NOTE)     The eGFR has been calculated using the CKD EPI equation.     This calculation has not been validated in all clinical situations.     eGFR's persistently <90 mL/min signify possible Chronic Kidney     Disease.  ETHANOL     Status: None   Collection Time    07/29/13 10:05 AM      Result Value Range   Alcohol, Ethyl (B) <11  0 - 11 mg/dL   Comment:            LOWEST DETECTABLE LIMIT FOR     SERUM ALCOHOL IS 11 mg/dL     FOR MEDICAL PURPOSES ONLY  SALICYLATE LEVEL     Status: Abnormal   Collection Time    07/29/13 10:05 AM      Result Value Range   Salicylate Lvl <2.0 (*) 2.8 - 20.0 mg/dL   Labs are reviewed and are pertinent for no psychiatric issues  Current Facility-Administered Medications  Medication Dose Route Frequency Provider Last Rate Last Dose  . acetaminophen (TYLENOL) tablet 650 mg  650 mg Oral Q4H PRN Shari A Upstill, PA-C      . LORazepam (ATIVAN) tablet 1 mg  1 mg Oral Q8H PRN Shari A Upstill, PA-C      . ondansetron (ZOFRAN) tablet 4 mg  4 mg Oral Q8H PRN Shari A Upstill, PA-C      . zolpidem (AMBIEN) tablet 5 mg  5 mg Oral QHS PRN Arnoldo Hooker, PA-C       Current Outpatient Prescriptions  Medication Sig Dispense Refill  . acetaminophen (TYLENOL) 325 MG tablet Take 650 mg by mouth every 6 (six) hours as needed for pain.      Marland Kitchen amoxicillin-clavulanate (AUGMENTIN) 875-125 MG per tablet Take 1 tablet by mouth every 12 (twelve) hours.  14 tablet  0  . ferrous sulfate 325 (65 FE) MG tablet Take 1 tablet (325 mg total) by mouth 2 (two) times daily with a meal.   60 tablet  3  .  ibuprofen (ADVIL,MOTRIN) 800 MG tablet Take 1 tablet (800 mg total) by mouth every 8 (eight) hours as needed for pain.  60 tablet  2    Psychiatric Specialty Exam:     Blood pressure 117/74, pulse 83, temperature 98.8 F (37.1 C), temperature source Oral, resp. rate 24, SpO2 100.00%, unknown if currently breastfeeding.There is no weight on file to calculate BMI.  General Appearance: Fairly Groomed  Patent attorney::  Good  Speech:  Clear and Coherent and Normal Rate  Volume:  Normal  Mood:  Depressed  Affect:  Appropriate  Thought Process:  Coherent, Goal Directed and Logical  Orientation:  Full (Time, Place, and Person)  Thought Content:  Negative  Suicidal Thoughts:  No  Homicidal Thoughts:  No  Memory:  Immediate;   Good Recent;   Good Remote;   Good  Judgement:  Good  Insight:  Fair  Psychomotor Activity:  Normal  Concentration:  Good  Recall:  Good  Akathisia:  Negative  Handed:  Right  AIMS (if indicated):     Assets:  Communication Skills Desire for Improvement Resilience Social Support  Sleep:      Treatment Plan Summary: Daily contact with patient to assess and evaluate symptoms and progress in treatment Medication management will begin fluoxetine 10 mg and hold till she can go to [facility] for her tubal ligation on 30 Oct  Kaydin Labo D 07/29/2013 1:55 PM

## 2013-07-29 NOTE — ED Notes (Addendum)
Pt states that she last used cocaine yesterday.  Pt states doesn't wont to kill herself she just likes to use a nail to scratch herself.   Pt also states that she is scheduled to have a tubal ligation done on Thursday.

## 2013-07-29 NOTE — ED Notes (Signed)
Pt states that she was four months pregnant and had a miscarriage on 10/25. Yesterday she started hearing babies cry and is very anxious and tearful because she was on drugs and thinks her fault she lost the baby.

## 2013-07-30 ENCOUNTER — Encounter (HOSPITAL_COMMUNITY): Payer: Self-pay | Admitting: Registered Nurse

## 2013-07-30 ENCOUNTER — Other Ambulatory Visit: Payer: Self-pay

## 2013-07-30 ENCOUNTER — Encounter (HOSPITAL_COMMUNITY)
Admission: RE | Admit: 2013-07-30 | Discharge: 2013-07-30 | Disposition: A | Discharge status: Home / Self Care | Payer: Medicaid Other | Source: Ambulatory Visit | Attending: Obstetrics & Gynecology | Admitting: Obstetrics & Gynecology

## 2013-07-30 ENCOUNTER — Encounter (HOSPITAL_COMMUNITY): Payer: Self-pay

## 2013-07-30 DIAGNOSIS — Z3009 Encounter for other general counseling and advice on contraception: Secondary | ICD-10-CM | POA: Diagnosis present

## 2013-07-30 DIAGNOSIS — F1410 Cocaine abuse, uncomplicated: Secondary | ICD-10-CM

## 2013-07-30 DIAGNOSIS — F331 Major depressive disorder, recurrent, moderate: Secondary | ICD-10-CM

## 2013-07-30 HISTORY — DX: Unspecified injury of head, initial encounter: S09.90XA

## 2013-07-30 LAB — CBC
Hemoglobin: 8.7 g/dL — ABNORMAL LOW (ref 12.0–15.0)
MCH: 29.4 pg (ref 26.0–34.0)
MCV: 87.5 fL (ref 78.0–100.0)
Platelets: 440 10*3/uL — ABNORMAL HIGH (ref 150–400)
RBC: 2.96 MIL/uL — ABNORMAL LOW (ref 3.87–5.11)
RDW: 14.9 % (ref 11.5–15.5)
WBC: 10.2 10*3/uL (ref 4.0–10.5)

## 2013-07-30 MED ORDER — FLUOXETINE HCL 10 MG PO CAPS: 10.0000 mg | ORAL_CAPSULE | Freq: Every day | ORAL | Status: AC

## 2013-07-30 NOTE — Progress Notes (Signed)
Per discussion with psychiatrist and Np, patient psychiatrically stable for discharge home with outpatient. CSW met with pt at bedside to discuss outpatient resources including outpatient psychiatric, mobile crisis, and outpatient substance abuse resources. Pt plans to review substance abuse resources and follow up after procedure scheduled for Thursday 07/31/2013.  Catha Gosselin, LCSW [phone]  ED CSW .07/30/2013 11:17am

## 2013-07-30 NOTE — ED Notes (Signed)
Patient resting quietly in bed with respirations even, unlabored. Safety maintained.

## 2013-07-30 NOTE — Patient Instructions (Signed)
Your procedure is scheduled on: 07/31/2013  Enter through the Main Entrance of [facility] at: 0915am  Pick up the phone at the desk and dial [phone].  Call this number if you have problems the morning of surgery: [phone].  Remember: Do NOT eat food: AFTER MIDNIGHT 07/30/2013 Do NOT drink clear liquids after: AFTER MIDNIGHT 07/30/2013 Take these medicines the morning of surgery with a SIP OF WATER: NONE  Do NOT wear jewelry (body piercing), make-up, or nail polish. Do NOT wear lotions, powders, or perfumes.  You may wear deoderant. Do NOT shave for 48 hours prior to surgery. Do NOT bring valuables to the hospital. Contacts, dentures, or bridgework may not be worn into surgery.  Have a responsible adult drive you home and stay with you for 24 hours after your procedure

## 2013-07-30 NOTE — Consult Note (Signed)
Follow up [facility] Face-to-Face Psychiatry Consult   Reason for Consult:  Hearing babies crying after her miscarriage with depression and scratching her arms Referring Physician:  ER MD   Regina Coleman is an 27 y.o. female.  Assessment: AXIS I:  major depression recurrent moderate,cocaine abuse AXIS II:  Deferred AXIS III:   Past Medical History  Diagnosis Date  . Genital herpes     Also Chlamydia, GC, HPV and Trich  . Mental disorder   . Bipolar 2 disorder     pt says r/o bipolar.  . ADHD (attention deficit hyperactivity disorder)   . Abnormal Pap smear     during preg, will retest after  . Abnormal Pap smear and cervical HPV (human papillomavirus)   . Anemia   . Depression   . Anxiety   . Cocaine abuse    AXIS IV:  economic problems, housing problems and has 8 children and just lost the ninth one after 4 months AXIS V:  51-60 moderate symptoms  Plan:  No evidence of imminent risk to self or others at present.    Subjective:   Regina Coleman is a 27 y.o. female patient admitted with depression after the loss of her baby to miscarriage.  Today patient states that she feels better and that she is no longer hearing the baby cry.  Patient states that she slept well last night.  "I am suppose to  Be at [facility] tomorrow morning for my tubal ligation and I can't miss that.  Patient denies suicidal/homicidal ideation, psychosis, and paranoia.  Patient states that she will be able to go home with a cousin today who will take her to the hospital in the morning and then she will go stay with her grandmother after the tuba ligation.  Patient states that she is interested in therapy and rehab services.  HPI Elements:   Location:  ER. Quality:  hearing babies crying and cutting herself. Severity:  moderate. Timing:  just had a miscarriage 25 oct. Duration:  chronic depression and cocaine use. Context:  miscarriage.  Past Psychiatric History: Past Medical History  Diagnosis  Date  . Genital herpes     Also Chlamydia, GC, HPV and Trich  . Mental disorder   . Bipolar 2 disorder     pt says r/o bipolar.  . ADHD (attention deficit hyperactivity disorder)   . Abnormal Pap smear     during preg, will retest after  . Abnormal Pap smear and cervical HPV (human papillomavirus)   . Anemia   . Depression   . Anxiety   . Cocaine abuse     reports that she has never smoked. She has never used smokeless tobacco. She reports that she uses illicit drugs ("Crack" cocaine and Cocaine). She reports that she does not drink alcohol. Family History  Problem Relation Age of Onset  . Diabetes Paternal Grandmother   . Heart disease Paternal Grandmother   . Hypertension Paternal Grandmother   . Other Neg Hx            Allergies:  No Known Allergies  ACT Assessment Complete:  Yes:    Educational Status    Risk to Self: Risk to self Is patient at risk for suicide?: No, but patient needs Medical Clearance Substance abuse history and/or treatment for substance abuse?: Yes  Risk to Others:    Abuse:    Prior Inpatient Therapy:    Prior Outpatient Therapy:    Additional Information:  Objective: Blood pressure 113/71, pulse 99, temperature 99.7 F (37.6 C), temperature source Oral, resp. rate 16, SpO2 100.00%, unknown if currently breastfeeding.There is no weight on file to calculate BMI. Results for orders placed during the hospital encounter of 07/29/13 (from the past 72 hour(s))  URINE RAPID DRUG SCREEN (HOSP PERFORMED)     Status: Abnormal   Collection Time    07/29/13  9:41 AM      Result Value Range   Opiates NONE DETECTED  NONE DETECTED   Cocaine POSITIVE (*) NONE DETECTED   Benzodiazepines NONE DETECTED  NONE DETECTED   Amphetamines NONE DETECTED  NONE DETECTED   Tetrahydrocannabinol NONE DETECTED  NONE DETECTED   Barbiturates NONE DETECTED  NONE DETECTED   Comment:            DRUG SCREEN FOR MEDICAL PURPOSES     ONLY.  IF  CONFIRMATION IS NEEDED     FOR ANY PURPOSE, NOTIFY LAB     WITHIN 5 DAYS.                LOWEST DETECTABLE LIMITS     FOR URINE DRUG SCREEN     Drug Class       Cutoff (ng/mL)     Amphetamine      1000     Barbiturate      200     Benzodiazepine   200     Tricyclics       300     Opiates          300     Cocaine          300     THC              50  POCT PREGNANCY, URINE     Status: Abnormal   Collection Time    07/29/13  9:45 AM      Result Value Range   Preg Test, Ur POSITIVE (*) NEGATIVE   Comment:            THE SENSITIVITY OF THIS     METHODOLOGY IS >24 mIU/mL  ACETAMINOPHEN LEVEL     Status: None   Collection Time    07/29/13 10:05 AM      Result Value Range   Acetaminophen (Tylenol), Serum <15.0  10 - 30 ug/mL   Comment:            THERAPEUTIC CONCENTRATIONS VARY     SIGNIFICANTLY. A RANGE OF 10-30     ug/mL MAY BE AN EFFECTIVE     CONCENTRATION FOR MANY PATIENTS.     HOWEVER, SOME ARE BEST TREATED     AT CONCENTRATIONS OUTSIDE THIS     RANGE.     ACETAMINOPHEN CONCENTRATIONS     >150 ug/mL AT 4 HOURS AFTER     INGESTION AND >50 ug/mL AT 12     HOURS AFTER INGESTION ARE     OFTEN ASSOCIATED WITH TOXIC     REACTIONS.  CBC     Status: Abnormal   Collection Time    07/29/13 10:05 AM      Result Value Range   WBC 7.4  4.0 - 10.5 K/uL   RBC 2.92 (*) 3.87 - 5.11 MIL/uL   Hemoglobin 8.6 (*) 12.0 - 15.0 g/dL   HCT 16.1 (*) 09.6 - 04.5 %   MCV 87.0  78.0 - 100.0 fL   MCH 29.5  26.0 - 34.0 pg   MCHC 33.9  30.0 - 36.0 g/dL   RDW 16.1  09.6 - 04.5 %   Platelets 365  150 - 400 K/uL  COMPREHENSIVE METABOLIC PANEL     Status: Abnormal   Collection Time    07/29/13 10:05 AM      Result Value Range   Sodium 137  135 - 145 mEq/L   Potassium 4.0  3.5 - 5.1 mEq/L   Chloride 105  96 - 112 mEq/L   CO2 24  19 - 32 mEq/L   Glucose, Bld 87  70 - 99 mg/dL   BUN 7  6 - 23 mg/dL   Creatinine, Ser 4.09  0.50 - 1.10 mg/dL   Calcium 8.9  8.4 - 81.1 mg/dL   Total Protein 6.2   6.0 - 8.3 g/dL   Albumin 2.7 (*) 3.5 - 5.2 g/dL   AST 18  0 - 37 U/L   ALT 12  0 - 35 U/L   Alkaline Phosphatase 61  39 - 117 U/L   Total Bilirubin <0.1 (*) 0.3 - 1.2 mg/dL   GFR calc non Af Amer >90  >90 mL/min   GFR calc Af Amer >90  >90 mL/min   Comment: (NOTE)     The eGFR has been calculated using the CKD EPI equation.     This calculation has not been validated in all clinical situations.     eGFR's persistently <90 mL/min signify possible Chronic Kidney     Disease.  ETHANOL     Status: None   Collection Time    07/29/13 10:05 AM      Result Value Range   Alcohol, Ethyl (B) <11  0 - 11 mg/dL   Comment:            LOWEST DETECTABLE LIMIT FOR     SERUM ALCOHOL IS 11 mg/dL     FOR MEDICAL PURPOSES ONLY  SALICYLATE LEVEL     Status: Abnormal   Collection Time    07/29/13 10:05 AM      Result Value Range   Salicylate Lvl <2.0 (*) 2.8 - 20.0 mg/dL   Labs are reviewed and are pertinent for no psychiatric issues  Current Facility-Administered Medications  Medication Dose Route Frequency Provider Last Rate Last Dose  . acetaminophen (TYLENOL) tablet 650 mg  650 mg Oral Q4H PRN Ocie Cornfield Upstill, PA-C   650 mg at 07/30/13 0834  . LORazepam (ATIVAN) tablet 1 mg  1 mg Oral Q8H PRN Shari A Upstill, PA-C      . ondansetron (ZOFRAN) tablet 4 mg  4 mg Oral Q8H PRN Shari A Upstill, PA-C      . zolpidem (AMBIEN) tablet 5 mg  5 mg Oral QHS PRN Arnoldo Hooker, PA-C       Current Outpatient Prescriptions  Medication Sig Dispense Refill  . acetaminophen (TYLENOL) 325 MG tablet Take 650 mg by mouth every 6 (six) hours as needed for pain.      Marland Kitchen amoxicillin-clavulanate (AUGMENTIN) 875-125 MG per tablet Take 1 tablet by mouth every 12 (twelve) hours.  14 tablet  0  . ferrous sulfate 325 (65 FE) MG tablet Take 1 tablet (325 mg total) by mouth 2 (two) times daily with a meal.  60 tablet  3  . ibuprofen (ADVIL,MOTRIN) 800 MG tablet Take 1 tablet (800 mg total) by mouth every 8 (eight) hours as  needed for pain.  60 tablet  2  . FLUoxetine (PROZAC) 10 MG capsule Take 1 capsule (10 mg  total) by mouth daily.  30 capsule  0    Psychiatric Specialty Exam:     Blood pressure 113/71, pulse 99, temperature 99.7 F (37.6 C), temperature source Oral, resp. rate 16, SpO2 100.00%, unknown if currently breastfeeding.There is no weight on file to calculate BMI.  General Appearance: Fairly Groomed  Patent attorney::  Good  Speech:  Clear and Coherent and Normal Rate  Volume:  Normal  Mood:  Depressed  Affect:  Appropriate  Thought Process:  Coherent, Goal Directed and Logical  Orientation:  Full (Time, Place, and Person)  Thought Content:  Negative  Suicidal Thoughts:  No  Homicidal Thoughts:  No  Memory:  Immediate;   Good Recent;   Good Remote;   Good  Judgement:  Good  Insight:  Fair  Psychomotor Activity:  Normal  Concentration:  Good  Recall:  Good  Akathisia:  Negative  Handed:  Right  AIMS (if indicated):     Assets:  Communication Skills Desire for Improvement Resilience Social Support  Sleep:      Face to face interview and consult with Dr. Ladona Ridgel  Treatment Plan Summary: Outpatient services  Disposition:  Discharge home.  CW to set up with outpatient services for therapy and medication management.  Give patient information on rehab services and facilities.   Rankin, Shuvon, FNP-BC 07/30/2013 12:53 PM  I have personally seen the patient and agreed with the findings and involved in the treatment plan. Kathryne Sharper, MD

## 2013-07-30 NOTE — ED Provider Notes (Signed)
She is being followed by psychiatry, who has seen her today, and deemed that she is stable for discharge. She plans on having a BTL, tomorrow. She is being referred to outpatient psychiatric resources.  Flint Melter, MD 07/30/13 1136

## 2013-07-31 ENCOUNTER — Encounter (HOSPITAL_COMMUNITY)
Admission: RE | Disposition: A | Discharge status: Home / Self Care | Payer: Self-pay | Source: Ambulatory Visit | Attending: Obstetrics & Gynecology

## 2013-07-31 ENCOUNTER — Ambulatory Visit (HOSPITAL_COMMUNITY): Payer: Medicaid Other | Admitting: Anesthesiology

## 2013-07-31 ENCOUNTER — Encounter (HOSPITAL_COMMUNITY): Payer: Medicaid Other | Admitting: Anesthesiology

## 2013-07-31 ENCOUNTER — Ambulatory Visit (HOSPITAL_COMMUNITY)
Admission: RE | Admit: 2013-07-31 | Discharge: 2013-07-31 | Disposition: A | Discharge status: Home / Self Care | Payer: Medicaid Other | Source: Ambulatory Visit | Attending: Obstetrics & Gynecology | Admitting: Obstetrics & Gynecology

## 2013-07-31 ENCOUNTER — Encounter (HOSPITAL_COMMUNITY): Payer: Self-pay | Admitting: *Deleted

## 2013-07-31 DIAGNOSIS — Z302 Encounter for sterilization: Secondary | ICD-10-CM

## 2013-07-31 DIAGNOSIS — Z641 Problems related to multiparity: Secondary | ICD-10-CM

## 2013-07-31 DIAGNOSIS — Z3009 Encounter for other general counseling and advice on contraception: Secondary | ICD-10-CM

## 2013-07-31 HISTORY — PX: LAPAROSCOPIC TUBAL LIGATION: SHX1937

## 2013-07-31 LAB — RAPID URINE DRUG SCREEN, HOSP PERFORMED
Amphetamines: NOT DETECTED
Opiates: NOT DETECTED
Tetrahydrocannabinol: NOT DETECTED

## 2013-07-31 LAB — PREGNANCY, URINE: Preg Test, Ur: POSITIVE — AB

## 2013-07-31 SURGERY — LIGATION, FALLOPIAN TUBE, LAPAROSCOPIC
Anesthesia: General | Site: Abdomen | Laterality: Bilateral | Wound class: Clean

## 2013-07-31 MED ORDER — LIDOCAINE HCL (CARDIAC) 20 MG/ML IV SOLN
INTRAVENOUS | Status: DC | PRN
  Administered 2013-07-31: 25 mg via INTRAVENOUS
  Administered 2013-07-31: 30 mg via INTRAVENOUS

## 2013-07-31 MED ORDER — LACTATED RINGERS IV SOLN
INTRAVENOUS | Status: DC
  Administered 2013-07-31 (×2): via INTRAVENOUS

## 2013-07-31 MED ORDER — PROPOFOL 10 MG/ML IV BOLUS
INTRAVENOUS | Status: DC | PRN
  Administered 2013-07-31: 30 mg via INTRAVENOUS
  Administered 2013-07-31: 170 mg via INTRAVENOUS

## 2013-07-31 MED ORDER — CEFAZOLIN SODIUM-DEXTROSE 2-3 GM-% IV SOLR
INTRAVENOUS | Status: AC
  Filled 2013-07-31: qty 50

## 2013-07-31 MED ORDER — PHENYLEPHRINE HCL 10 MG/ML IJ SOLN
INTRAMUSCULAR | Status: DC | PRN
  Administered 2013-07-31 (×3): 40 ug via INTRAVENOUS
  Administered 2013-07-31: 80 ug via INTRAVENOUS

## 2013-07-31 MED ORDER — ROCURONIUM BROMIDE 100 MG/10ML IV SOLN
INTRAVENOUS | Status: DC | PRN
  Administered 2013-07-31: 25 mg via INTRAVENOUS

## 2013-07-31 MED ORDER — FENTANYL CITRATE 0.05 MG/ML IJ SOLN
INTRAMUSCULAR | Status: AC
  Filled 2013-07-31: qty 2

## 2013-07-31 MED ORDER — ROCURONIUM BROMIDE 100 MG/10ML IV SOLN
INTRAVENOUS | Status: AC
  Filled 2013-07-31: qty 1

## 2013-07-31 MED ORDER — GLYCOPYRROLATE 0.2 MG/ML IJ SOLN
INTRAMUSCULAR | Status: AC
  Filled 2013-07-31: qty 3

## 2013-07-31 MED ORDER — DEXAMETHASONE SODIUM PHOSPHATE 10 MG/ML IJ SOLN
INTRAMUSCULAR | Status: AC
  Filled 2013-07-31: qty 1

## 2013-07-31 MED ORDER — BUPIVACAINE HCL (PF) 0.5 % IJ SOLN
INTRAMUSCULAR | Status: DC | PRN
  Administered 2013-07-31: 14 mL

## 2013-07-31 MED ORDER — CEFAZOLIN SODIUM-DEXTROSE 2-3 GM-% IV SOLR
INTRAVENOUS | Status: DC | PRN
  Administered 2013-07-31: 2 g via INTRAVENOUS

## 2013-07-31 MED ORDER — OXYCODONE-ACETAMINOPHEN 5-325 MG PO TABS
ORAL_TABLET | ORAL | Status: AC
  Administered 2013-07-31: 1 via ORAL
  Filled 2013-07-31: qty 1

## 2013-07-31 MED ORDER — MIDAZOLAM HCL 2 MG/2ML IJ SOLN
INTRAMUSCULAR | Status: AC
  Filled 2013-07-31: qty 2

## 2013-07-31 MED ORDER — LIDOCAINE HCL (CARDIAC) 20 MG/ML IV SOLN
INTRAVENOUS | Status: AC
  Filled 2013-07-31: qty 5

## 2013-07-31 MED ORDER — PROPOFOL 10 MG/ML IV EMUL
INTRAVENOUS | Status: AC
  Filled 2013-07-31: qty 20

## 2013-07-31 MED ORDER — KETOROLAC TROMETHAMINE 30 MG/ML IJ SOLN
INTRAMUSCULAR | Status: AC
  Filled 2013-07-31: qty 1

## 2013-07-31 MED ORDER — FENTANYL CITRATE 0.05 MG/ML IJ SOLN
INTRAMUSCULAR | Status: DC | PRN
  Administered 2013-07-31 (×2): 50 ug via INTRAVENOUS

## 2013-07-31 MED ORDER — OXYCODONE-ACETAMINOPHEN 5-325 MG PO TABS: 1.0000 | ORAL_TABLET | ORAL | Status: AC | PRN

## 2013-07-31 MED ORDER — ONDANSETRON HCL 4 MG/2ML IJ SOLN
INTRAMUSCULAR | Status: DC | PRN
  Administered 2013-07-31: 4 mg via INTRAVENOUS

## 2013-07-31 MED ORDER — 0.9 % SODIUM CHLORIDE (POUR BTL) OPTIME
TOPICAL | Status: DC | PRN
  Administered 2013-07-31: 1000 mL

## 2013-07-31 MED ORDER — ONDANSETRON HCL 4 MG/2ML IJ SOLN
INTRAMUSCULAR | Status: AC
  Filled 2013-07-31: qty 2

## 2013-07-31 MED ORDER — KETOROLAC TROMETHAMINE 30 MG/ML IJ SOLN
INTRAMUSCULAR | Status: DC | PRN
  Administered 2013-07-31: 30 mg via INTRAVENOUS

## 2013-07-31 MED ORDER — NEOSTIGMINE METHYLSULFATE 1 MG/ML IJ SOLN
INTRAMUSCULAR | Status: AC
  Filled 2013-07-31: qty 1

## 2013-07-31 MED ORDER — BUPIVACAINE HCL (PF) 0.5 % IJ SOLN
INTRAMUSCULAR | Status: AC
  Filled 2013-07-31: qty 30

## 2013-07-31 MED ORDER — NEOSTIGMINE METHYLSULFATE 1 MG/ML IJ SOLN
INTRAMUSCULAR | Status: DC | PRN
  Administered 2013-07-31: 3 mg via INTRAVENOUS

## 2013-07-31 MED ORDER — OXYCODONE-ACETAMINOPHEN 5-325 MG PO TABS
1.0000 | ORAL_TABLET | ORAL | Status: DC | PRN
  Administered 2013-07-31: 1 via ORAL

## 2013-07-31 MED ORDER — GLYCOPYRROLATE 0.2 MG/ML IJ SOLN
INTRAMUSCULAR | Status: DC | PRN
  Administered 2013-07-31: 0.6 mg via INTRAVENOUS

## 2013-07-31 MED ORDER — DEXAMETHASONE SODIUM PHOSPHATE 10 MG/ML IJ SOLN
INTRAMUSCULAR | Status: DC | PRN
  Administered 2013-07-31: 10 mg via INTRAVENOUS

## 2013-07-31 MED ORDER — MIDAZOLAM HCL 2 MG/2ML IJ SOLN
INTRAMUSCULAR | Status: DC | PRN
  Administered 2013-07-31: 1 mg via INTRAVENOUS

## 2013-07-31 SURGICAL SUPPLY — 23 items
BANDAGE ADHESIVE 1X3 (GAUZE/BANDAGES/DRESSINGS) ×3 IMPLANT
CABLE HIGH FREQUENCY MONO STRZ (ELECTRODE) IMPLANT
CATH ROBINSON RED A/P 16FR (CATHETERS) ×2 IMPLANT
CLOTH BEACON ORANGE TIMEOUT ST (SAFETY) ×2 IMPLANT
DURAPREP 26ML APPLICATOR (WOUND CARE) ×3 IMPLANT
ELECT REM PT RETURN 9FT ADLT (ELECTROSURGICAL)
ELECTRODE REM PT RTRN 9FT ADLT (ELECTROSURGICAL) IMPLANT
GLOVE BIO SURGEON STRL SZ 6.5 (GLOVE) ×3 IMPLANT
GLOVE BIO SURGEON STRL SZ7 (GLOVE) ×1 IMPLANT
GLOVE SURG SS PI 7.0 STRL IVOR (GLOVE) ×2 IMPLANT
GOWN PREVENTION PLUS LG XLONG (DISPOSABLE) ×4 IMPLANT
NDL SAFETY ECLIPSE 18X1.5 (NEEDLE) ×1 IMPLANT
NEEDLE HYPO 18GX1.5 SHARP (NEEDLE) ×2
NEEDLE INSUFFLATION 120MM (ENDOMECHANICALS) ×2 IMPLANT
PACK LAPAROSCOPY BASIN (CUSTOM PROCEDURE TRAY) ×2 IMPLANT
SCALPEL HARMONIC ACE (MISCELLANEOUS) ×1 IMPLANT
SUT VICRYL 0 UR6 27IN ABS (SUTURE) ×2 IMPLANT
SUT VICRYL 4-0 PS2 18IN ABS (SUTURE) ×2 IMPLANT
TOWEL OR 17X24 6PK STRL BLUE (TOWEL DISPOSABLE) ×4 IMPLANT
TROCAR OPTI TIP 5M 100M (ENDOMECHANICALS) ×2 IMPLANT
TROCAR XCEL DIL TIP R 11M (ENDOMECHANICALS) ×2 IMPLANT
TROCAR XCEL OPT SLVE 5M 100M (ENDOMECHANICALS) ×1 IMPLANT
WATER STERILE IRR 1000ML POUR (IV SOLUTION) ×2 IMPLANT

## 2013-07-31 NOTE — Transfer of Care (Signed)
Immediate Anesthesia Transfer of Care Note  Patient: Regina Coleman  Procedure(s) Performed: Procedure(s): LAPAROSCOPIC TUBAL LIGATION (Bilateral)  Patient Location: PACU  Anesthesia Type:General  Level of Consciousness: awake, oriented and patient cooperative  Airway & Oxygen Therapy: Patient Spontanous Breathing and Patient connected to nasal cannula oxygen  Post-op Assessment: Report given to PACU RN and Post -op Vital signs reviewed and stable  Post vital signs: Reviewed and stable  Complications: No apparent anesthesia complications

## 2013-07-31 NOTE — Anesthesia Postprocedure Evaluation (Signed)
  Anesthesia Post Note  Patient: Regina Coleman  Procedure(s) Performed: Procedure(s) (LRB): LAPAROSCOPIC TUBAL LIGATION (Bilateral)  Anesthesia type: GA  Patient location: PACU  Post pain: Pain level controlled  Post assessment: Post-op Vital signs reviewed  Last Vitals:  Filed Vitals:   07/31/13 0908  BP: 108/59  Pulse: 75  Temp: 36.9 C  Resp: 20    Post vital signs: Reviewed  Level of consciousness: sedated  Complications: No apparent anesthesia complications

## 2013-07-31 NOTE — H&P (Signed)
Regina Coleman is an 27 y.o. S AA P8 who is here today for a laparoscopic bilateral salpingectomy for permanent sterility. She is certain that she does not want any more children.   Pertinent Gynecological History: Menses: flow is moderate Bleeding: normal Contraception: none DES exposure: denies Blood transfusions: none Sexually transmitted diseases: no past history Previous GYN Procedures: DNC  Last mammogram: n/a Date:  Last pap: normal Date:     Menstrual History: Menarche age:42 No LMP recorded.    Past Medical History  Diagnosis Date  . Genital herpes     Also Chlamydia, GC, HPV and Trich  . Mental disorder   . Bipolar 2 disorder     pt says r/o bipolar.  . ADHD (attention deficit hyperactivity disorder)   . Abnormal Pap smear     during preg, will retest after  . Abnormal Pap smear and cervical HPV (human papillomavirus)   . Anemia   . Depression   . Anxiety   . Cocaine abuse   . Head injury     hematoma on left back of head hit with crow bar    Past Surgical History  Procedure Laterality Date  . No past surgeries    . Dilation and evacuation N/A 07/26/2013    Procedure: DILATATION AND EVACUATION (D&E) 2ND TRIMESTER. with ultrasound;  Surgeon: Tereso Newcomer, MD;  Location: [facility];  Service: Obstetrics;  Laterality: N/A;  . Dnc      Family History  Problem Relation Age of Onset  . Diabetes Paternal Grandmother   . Heart disease Paternal Grandmother   . Hypertension Paternal Grandmother   . Other Neg Hx     Social History:  reports that she has never smoked. She has never used smokeless tobacco. She reports that she uses illicit drugs ("Crack" cocaine and Cocaine). She reports that she does not drink alcohol.  Allergies: No Known Allergies  Prescriptions prior to admission  Medication Sig Dispense Refill  . acetaminophen (TYLENOL) 325 MG tablet Take 650 mg by mouth every 6 (six) hours as needed for pain.      Marland Kitchen amoxicillin-clavulanate  (AUGMENTIN) 875-125 MG per tablet Take 1 tablet by mouth every 12 (twelve) hours.  14 tablet  0  . FLUoxetine (PROZAC) 10 MG capsule Take 1 capsule (10 mg total) by mouth daily.  30 capsule  0  . ibuprofen (ADVIL,MOTRIN) 800 MG tablet Take 1 tablet (800 mg total) by mouth every 8 (eight) hours as needed for pain.  60 tablet  2  . ferrous sulfate 325 (65 FE) MG tablet Take 1 tablet (325 mg total) by mouth 2 (two) times daily with a meal.  60 tablet  3    ROS  unknown if currently breastfeeding. Physical Exam Heart- rrr Lungs- CTAB Abd- benign  Results for orders placed during the hospital encounter of 07/30/13 (from the past 24 hour(s))  CBC     Status: Abnormal   Collection Time    07/30/13  4:00 PM      Result Value Range   WBC 10.2  4.0 - 10.5 K/uL   RBC 2.96 (*) 3.87 - 5.11 MIL/uL   Hemoglobin 8.7 (*) 12.0 - 15.0 g/dL   HCT 40.9 (*) 81.1 - 91.4 %   MCV 87.5  78.0 - 100.0 fL   MCH 29.4  26.0 - 34.0 pg   MCHC 33.6  30.0 - 36.0 g/dL   RDW 78.2  95.6 - 21.3 %   Platelets 440 (*) 150 -  400 K/uL    No results found.  Assessment/Plan: grandmultiparity- desires sterility. Risks of surgery explained, understood, and accepted.  Lemarcus Baggerly C. 07/31/2013, 9:08 AM

## 2013-07-31 NOTE — Op Note (Signed)
07/31/2013  11:22 AM  PATIENT:  Regina Coleman  27 y.o. female  PRE-OPERATIVE DIAGNOSIS:  Grand multiparity, desires sterilization  POST-OPERATIVE DIAGNOSIS:  same  PROCEDURE:  LAPAROSCOPIC BILATERAL SALPINGECTOMY  SURGEON:  Surgeon(s) and Role:    * Allie Bossier, MD - Primary  FINDINGS: perihepatic adhesions c/w a possible h/o PID Normal appearing oviducts and ovaries, no sign of endometiosis  PHYSICIAN ASSISTANT:   ASSISTANTS: none   ANESTHESIA:   general  EBL:  Total I/O In: 1000 [I.V.:1000] Out: 200 [Urine:200]  BLOOD ADMINISTERED:none  DRAINS: none   LOCAL MEDICATIONS USED:  MARCAINE     SPECIMEN:  Source of Specimen:  both oviducts  DISPOSITION OF SPECIMEN:  PATHOLOGY  COUNTS:  YES  TOURNIQUET:  * No tourniquets in log *  DICTATION: .Dragon Dictation  PLAN OF CARE: Discharge to home after PACU  PATIENT DISPOSITION:  PACU - hemodynamically stable.   Delay start of Pharmacological VTE agent (>24hrs) due to surgical blood loss or risk of bleeding: not applicable  .The risks, benefits, and alternatives of surgery were explained, understood, accepted. She is certain that she wants permanent sterility. She understands that this is not reversible. She understands there is a small failure rate of this procedure. She also would like to have both oviducts removed her due newest recommendations to help prevent ovarian/peritoneal cancer. In the operating room she was placed in the dorsal lithotomy position, and general anesthesia was given without complication. Her abdomen and vagina were prepped and draped in the usual sterile fashion. A timeout procedure was done. A bimanual exam revealed a small anteverted and mobile uterus. Her adnexa felt normal. A Hulka manipulator was placed. Her bladder was emptied with a Robinson catheter. Gloves were changed, and attention was turned to the abdomen. Approximately the 5 mL of 0.5% Marcaine was injected into the umbilicus. A  vertical incision was made at the site. A varies needle was placed intraperitoneally. Low-flow CO2 was used to insufflate the abdomen to approximately 3-1/2 L. Once a good pneumoperitoneum was established, a 10 mm Excel trocar was placed. Laparoscopy confirmed correct placement. A 5 mm port was placed in each lower quadrant under direct laparoscopic visualization after injecting 0.5% Marcaine in the incision sites. Her pelvis  appeared normal and perihepatic adhesions were noted . A Harmonic scapel was used to hemostatically removed both oviducts. I switched to a 5 mm camera and removed the oviducts through the umbilical port.  I removed the 5 mm ports and noted hemostasis. The umbilical fascia was closed with a 0 vicryl suture. No defects were palpable. A subcuticular closure was done with 4-0 Vicryl suture at all incision sites. A Steri-Strip was placed across each incision. She was extubated and taken to the recovery room in stable condition.

## 2013-07-31 NOTE — Anesthesia Preprocedure Evaluation (Signed)
Anesthesia Evaluation  Patient identified by MRN, date of birth, ID band Patient awake    Reviewed: Allergy & Precautions, H&P , NPO status , Patient's Chart, lab work & pertinent test results  Airway Mallampati: I TM Distance: >3 FB Neck ROM: full    Dental  (+) Teeth Intact and Poor Dentition   Pulmonary neg pulmonary ROS,    Pulmonary exam normal       Cardiovascular negative cardio ROS      Neuro/Psych PSYCHIATRIC DISORDERS Anxiety Depression negative neurological ROS     GI/Hepatic negative GI ROS, Neg liver ROS,   Endo/Other  negative endocrine ROS  Renal/GU negative Renal ROS  negative genitourinary   Musculoskeletal   Abdominal Normal abdominal exam  (+)   Peds  Hematology negative hematology ROS (+)   Anesthesia Other Findings   Reproductive/Obstetrics negative OB ROS                           Anesthesia Physical Anesthesia Plan  ASA: II  Anesthesia Plan: General   Post-op Pain Management:    Induction: Intravenous  Airway Management Planned: Oral ETT  Additional Equipment:   Intra-op Plan:   Post-operative Plan: Extubation in OR  Informed Consent: I have reviewed the patients History and Physical, chart, labs and discussed the procedure including the risks, benefits and alternatives for the proposed anesthesia with the patient or authorized representative who has indicated his/her understanding and acceptance.   Dental Advisory Given  Plan Discussed with: CRNA and Surgeon  Anesthesia Plan Comments:         Anesthesia Quick Evaluation

## 2013-08-01 ENCOUNTER — Encounter (HOSPITAL_COMMUNITY): Payer: Self-pay | Admitting: Obstetrics & Gynecology

## 2013-08-01 IMAGING — US US OB COMP LESS 14 WK
1 series · 14 of 18 positions shown · non-contrast
Comparison: none

[Series 1: us ob comp less 14 wks · 14 of 18 slices shown]
[im 1/18]
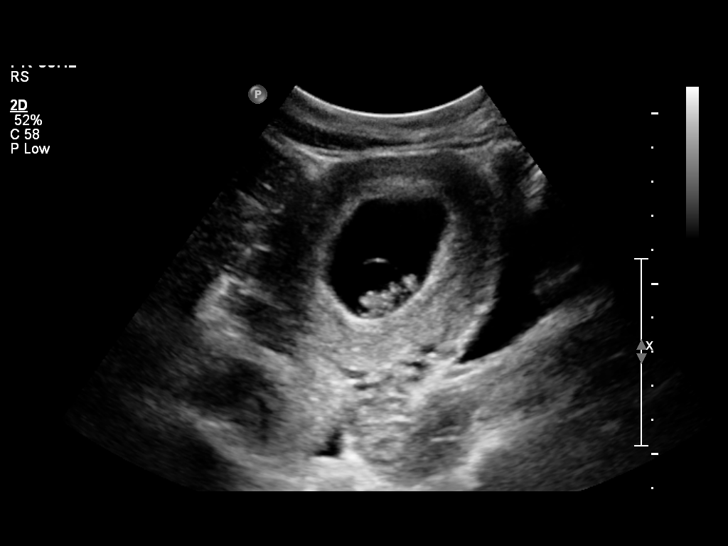
[im 2/18]
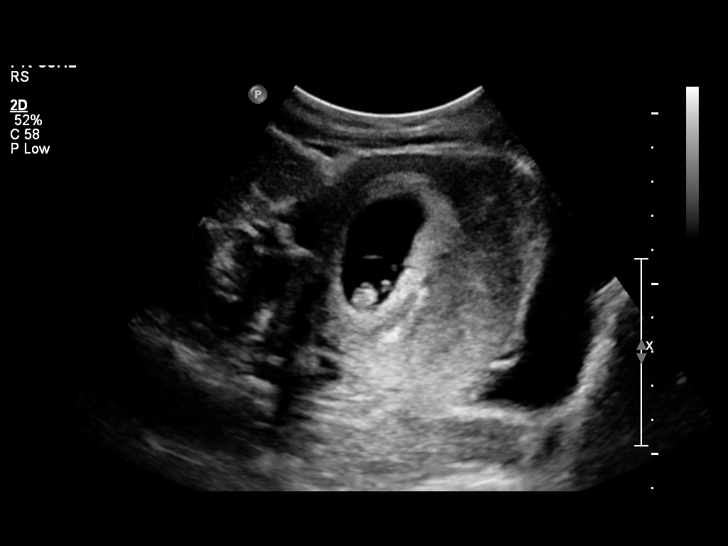
[im 4/18]
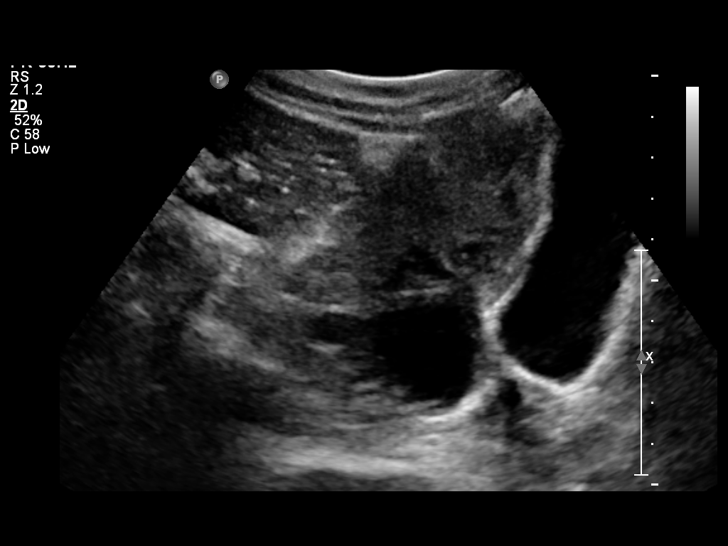
[im 5/18]
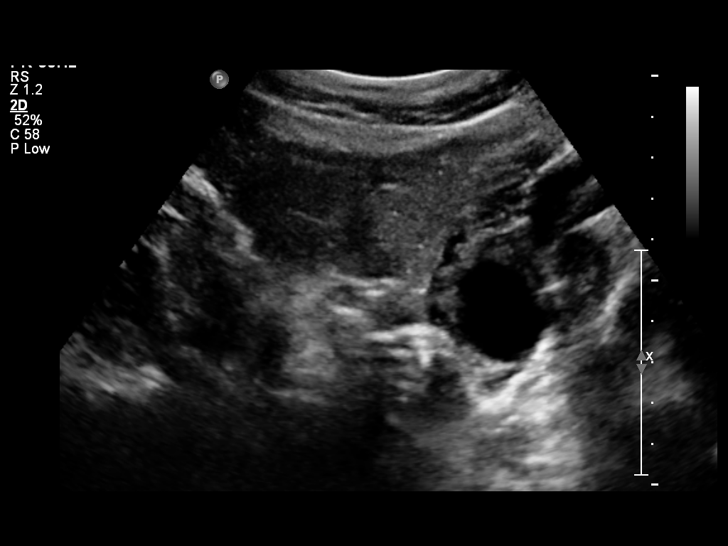
[im 6/18]
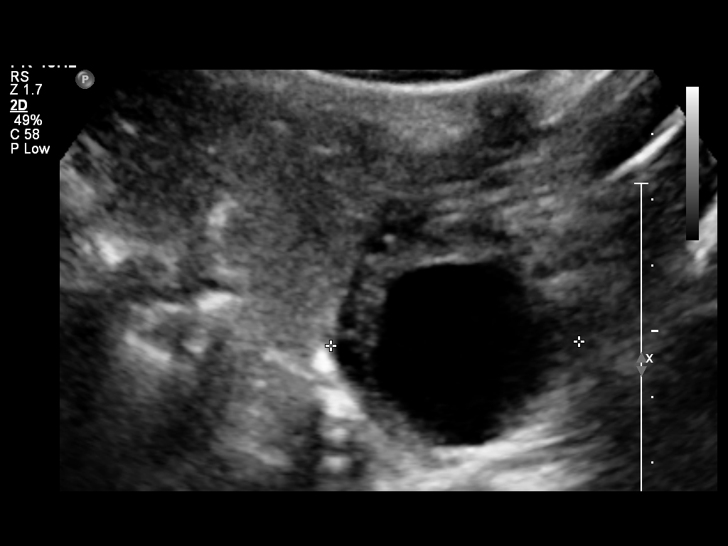
[im 8/18]
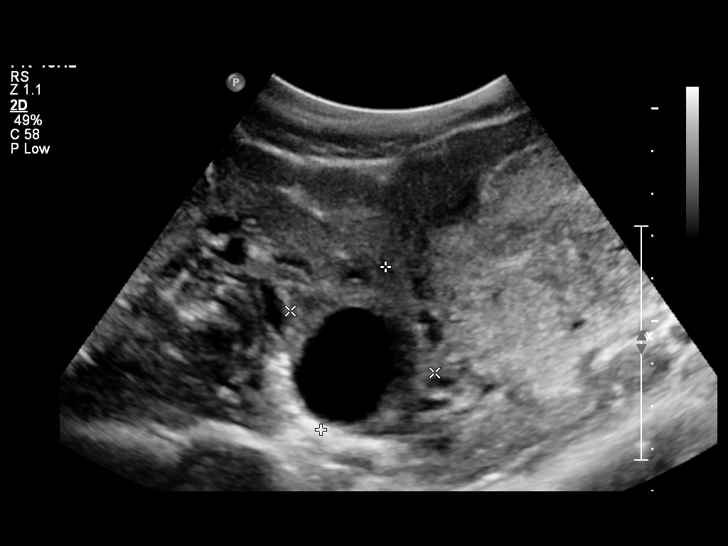
[im 9/18]
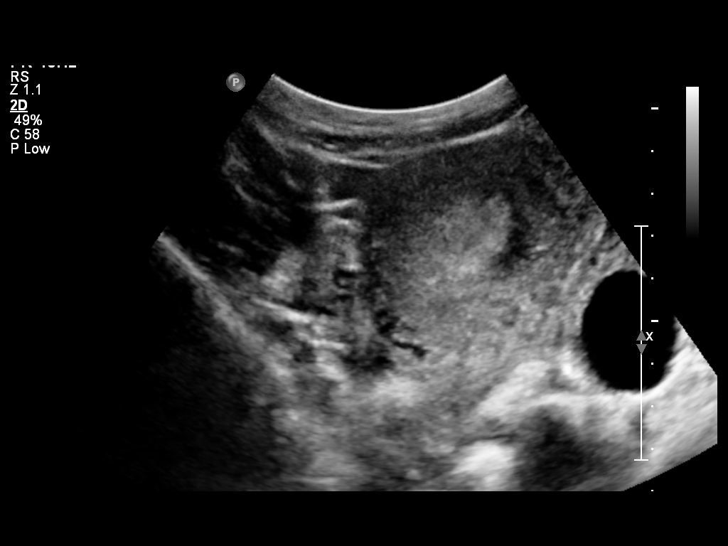
[im 10/18]
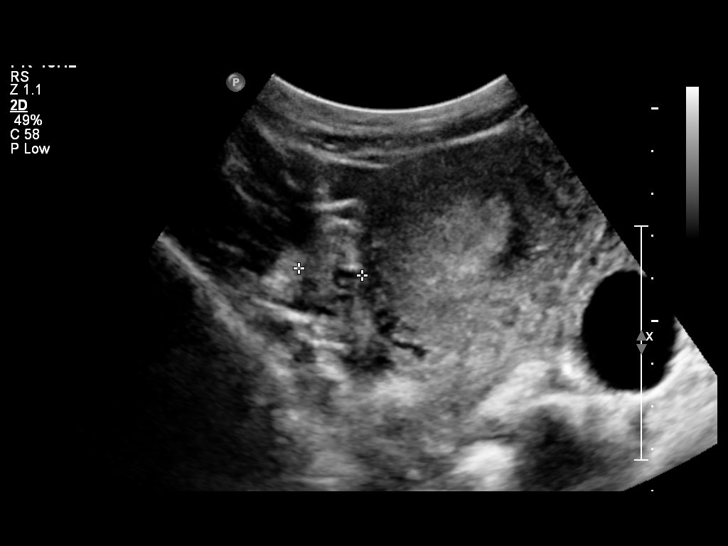
[im 11/18]
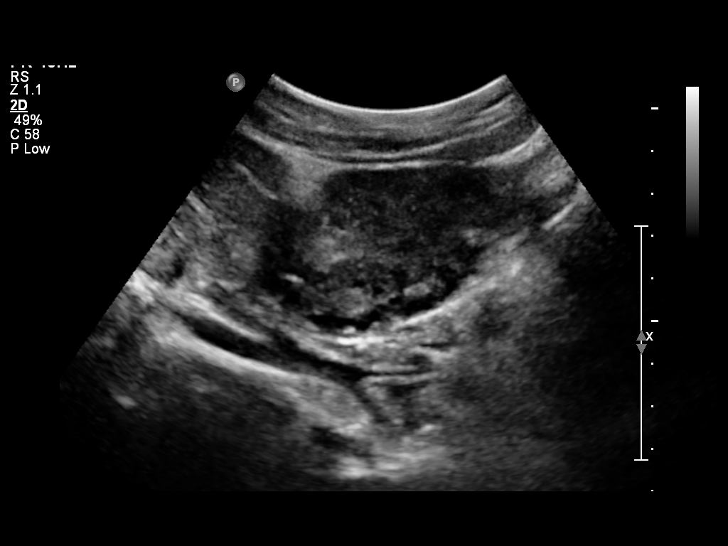
[im 13/18]
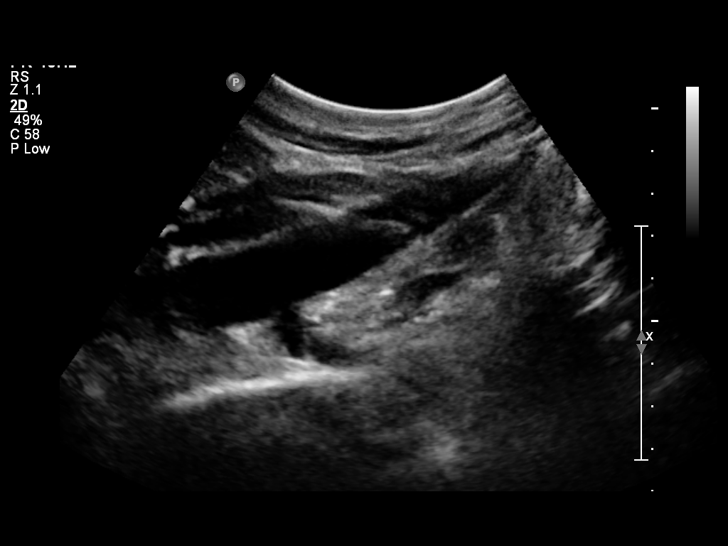
[im 14/18]
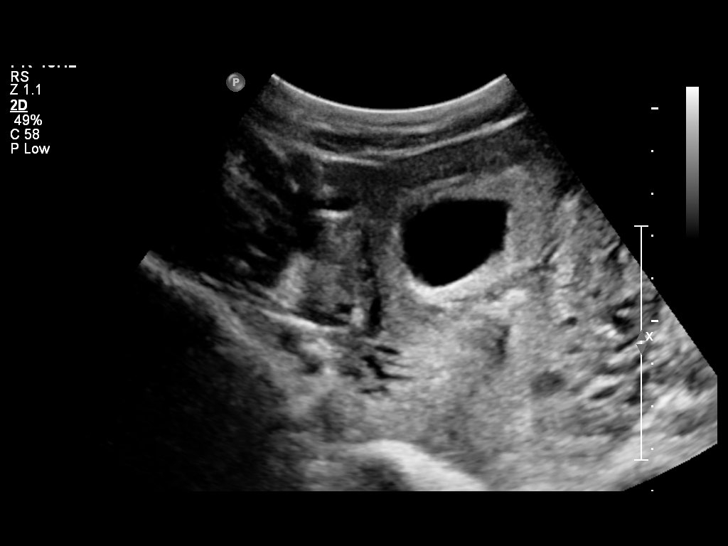
[im 15/18]
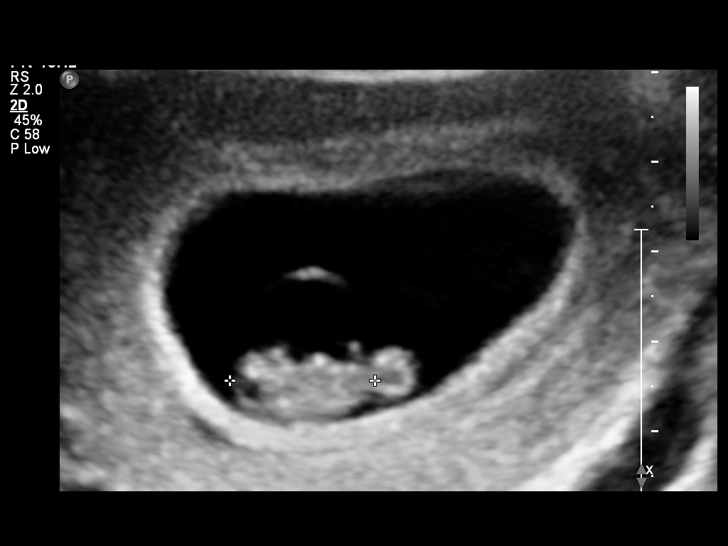
[im 17/18]
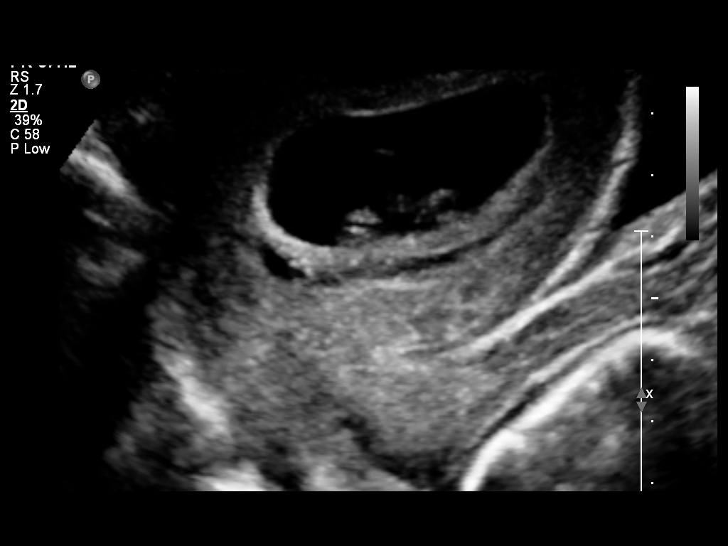
[im 18/18]
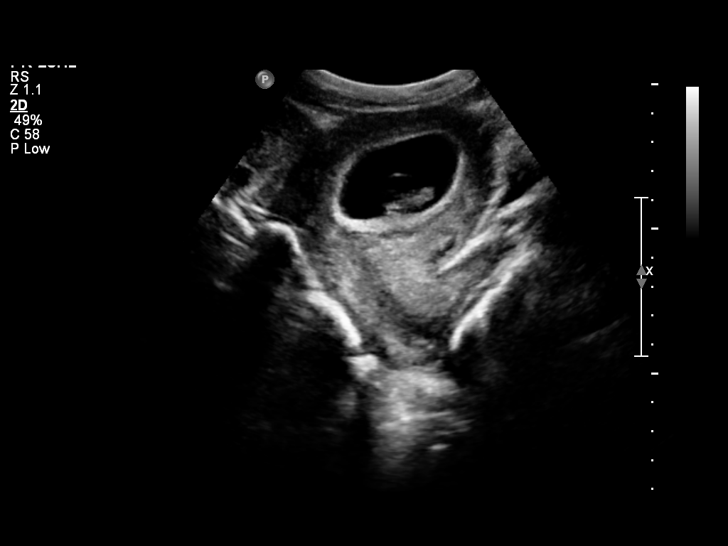

[14 of 18 positions shown; findings below may reference images not displayed]

OBSTETRICS REPORT
                      (Signed Final 04/24/2012 [DATE])

Procedures

 US OB COMP LESS 14 WKS                                76801.0
Indications

 Pain - LLQ
Fetal Evaluation

 Gest. Sac:         Intrauterine
 Yolk Sac:          Visualized
 Fetal Pole:        Visualized
 Fetal Heart Rate:  155                         bpm
 Cardiac Activity:  Observed

 Amniotic Fluid
 AFI FV:      Subjectively within normal limits
Biometry

 CRL:     16.1  mm    G. Age:   8w 0d                  EDD:   12/04/12
Gestational Age

 LMP:           12w 3d       Date:   01/28/12                 EDD:   11/03/12
 Best:          8w 0d     Det. By:   U/S C R L (04/24/12)     EDD:   12/04/12
Cervix Uterus Adnexa

 Cervix:       Closed.
 Cul De Sac:   No free fluid seen.

 Left Ovary:   Within normal limits. Small corpus luteum noted.
 Right Ovary:  Within normal limits.
 Adnexa:     No abnormality visualized.
Impression

 Single living IUP with US Gest. Age of 8w 0d, and EDD of
 12/04/2012.
 No significant maternal uterine or adnexal abnormality
 identified.

## 2013-08-15 ENCOUNTER — Encounter: Payer: Self-pay | Admitting: *Deleted

## 2013-09-04 ENCOUNTER — Encounter: Payer: Self-pay | Admitting: *Deleted

## 2013-10-14 ENCOUNTER — Encounter: Payer: Self-pay | Admitting: *Deleted

## 2013-11-10 ENCOUNTER — Encounter: Payer: Self-pay | Admitting: *Deleted

## 2014-02-04 ENCOUNTER — Encounter (HOSPITAL_COMMUNITY): Payer: Self-pay | Admitting: Obstetrics & Gynecology

## 2014-08-03 ENCOUNTER — Encounter (HOSPITAL_COMMUNITY): Payer: Self-pay | Admitting: Obstetrics & Gynecology

## 2014-08-29 ENCOUNTER — Emergency Department (HOSPITAL_COMMUNITY)
Admission: EM | Admit: 2014-08-29 | Discharge: 2014-08-29 | Disposition: A | Discharge status: Home / Self Care | Payer: Self-pay | Attending: Emergency Medicine | Admitting: Emergency Medicine

## 2014-08-29 ENCOUNTER — Encounter (HOSPITAL_COMMUNITY): Payer: Self-pay | Admitting: Emergency Medicine

## 2014-08-29 DIAGNOSIS — Z862 Personal history of diseases of the blood and blood-forming organs and certain disorders involving the immune mechanism: Secondary | ICD-10-CM | POA: Insufficient documentation

## 2014-08-29 DIAGNOSIS — F1410 Cocaine abuse, uncomplicated: Secondary | ICD-10-CM

## 2014-08-29 DIAGNOSIS — Z8742 Personal history of other diseases of the female genital tract: Secondary | ICD-10-CM | POA: Insufficient documentation

## 2014-08-29 DIAGNOSIS — Y9389 Activity, other specified: Secondary | ICD-10-CM | POA: Insufficient documentation

## 2014-08-29 DIAGNOSIS — Y9289 Other specified places as the place of occurrence of the external cause: Secondary | ICD-10-CM | POA: Insufficient documentation

## 2014-08-29 DIAGNOSIS — F329 Major depressive disorder, single episode, unspecified: Secondary | ICD-10-CM | POA: Insufficient documentation

## 2014-08-29 DIAGNOSIS — Z8619 Personal history of other infectious and parasitic diseases: Secondary | ICD-10-CM | POA: Insufficient documentation

## 2014-08-29 DIAGNOSIS — Y998 Other external cause status: Secondary | ICD-10-CM | POA: Insufficient documentation

## 2014-08-29 DIAGNOSIS — T405X2A Poisoning by cocaine, intentional self-harm, initial encounter: Secondary | ICD-10-CM | POA: Insufficient documentation

## 2014-08-29 DIAGNOSIS — Z87820 Personal history of traumatic brain injury: Secondary | ICD-10-CM | POA: Insufficient documentation

## 2014-08-29 DIAGNOSIS — F419 Anxiety disorder, unspecified: Secondary | ICD-10-CM | POA: Insufficient documentation

## 2014-08-29 DIAGNOSIS — T5791XA Toxic effect of unspecified inorganic substance, accidental (unintentional), initial encounter: Secondary | ICD-10-CM

## 2014-08-29 LAB — COMPREHENSIVE METABOLIC PANEL
ALBUMIN: 3.6 g/dL (ref 3.5–5.2)
ALT: 7 U/L (ref 0–35)
AST: 13 U/L (ref 0–37)
Alkaline Phosphatase: 76 U/L (ref 39–117)
Anion gap: 14 (ref 5–15)
BUN: 10 mg/dL (ref 6–23)
CALCIUM: 9.2 mg/dL (ref 8.4–10.5)
CO2: 22 mEq/L (ref 19–32)
CREATININE: 0.59 mg/dL (ref 0.50–1.10)
Chloride: 102 mEq/L (ref 96–112)
GFR calc Af Amer: >90 mL/min (ref >90)
GFR calc non Af Amer: >90 mL/min (ref >90)
Glucose, Bld: 158 mg/dL — ABNORMAL HIGH (ref 70–99)
Potassium: 3.3 mEq/L — ABNORMAL LOW (ref 3.7–5.3)
Sodium: 138 mEq/L (ref 137–147)
Total Bilirubin: 0.3 mg/dL (ref 0.3–1.2)
Total Protein: 7.1 g/dL (ref 6.0–8.3)

## 2014-08-29 LAB — CBC
HCT: 33.5 % — ABNORMAL LOW (ref 36.0–46.0)
Hemoglobin: 10.7 g/dL — ABNORMAL LOW (ref 12.0–15.0)
MCH: 27.4 pg (ref 26.0–34.0)
MCHC: 31.9 g/dL (ref 30.0–36.0)
MCV: 85.9 fL (ref 78.0–100.0)
PLATELETS: 365 10*3/uL (ref 150–400)
RBC: 3.9 MIL/uL (ref 3.87–5.11)
RDW: 13.3 % (ref 11.5–15.5)
WBC: 9.7 10*3/uL (ref 4.0–10.5)

## 2014-08-29 MED ORDER — PEG 3350-KCL-NA BICARB-NACL 420 G PO SOLR
4000.0000 mL | Freq: Once | ORAL | Status: AC
  Administered 2014-08-29: 4000 mL via ORAL
  Filled 2014-08-29: qty 4000

## 2014-08-29 MED ORDER — CHARCOAL ACTIVATED PO LIQD
50.0000 g | Freq: Once | ORAL | Status: AC
  Administered 2014-08-29: 50 g via ORAL
  Filled 2014-08-29: qty 240

## 2014-08-29 MED ORDER — ACETAMINOPHEN 500 MG PO TABS
1000.0000 mg | ORAL_TABLET | Freq: Once | ORAL | Status: AC
  Administered 2014-08-29: 1000 mg via ORAL
  Filled 2014-08-29: qty 2

## 2014-08-29 NOTE — ED Notes (Signed)
Pt completed drinking the charcoal. Warm blanket given.

## 2014-08-29 NOTE — ED Notes (Addendum)
She is very drowsy; and is refusing to drink any more GoLytely.  Police maintain their vigil.  When I insist that she continue to drink this per poison center recommendation; she shouts "I'm not going to keep drinking this--I didn't swallow any crack anyway!".

## 2014-08-29 NOTE — ED Provider Notes (Signed)
CSN: [id number]     Arrival date & time 08/29/14  1259 History   First MD Initiated Contact with Patient 08/29/14 1318     Chief Complaint  Patient presents with  . Swallowed 4 $20 bags of crack       The history is provided by the patient.   Patient reports that she ingested for $20 bags of crack cocaine.  This occurred at approximately 12:15 PM today.  This was an intentional ingestion as she was trying to evade police.  She reports they're wrapped in plastic.  She has no significant complaints at this time except for mild headache which she states she had prior to the ingestion.  She presents to the emergency department with normal vital signs   Past Medical History  Diagnosis Date  . Genital herpes     Also Chlamydia, GC, HPV and Trich  . Mental disorder   . Bipolar 2 disorder     pt says r/o bipolar.  . ADHD (attention deficit hyperactivity disorder)   . Abnormal Pap smear     during preg, will retest after  . Abnormal Pap smear and cervical HPV (human papillomavirus)   . Anemia   . Depression   . Anxiety   . Cocaine abuse   . Head injury     hematoma on left back of head hit with crow bar   Past Surgical History  Procedure Laterality Date  . No past surgeries    . Dilation and evacuation N/A 07/26/2013    Procedure: DILATATION AND EVACUATION (D&E) 2ND TRIMESTER. with ultrasound;  Surgeon: Tereso NewcomerUgonna A Anyanwu, MD;  Location: [facility];  Service: Obstetrics;  Laterality: N/A;  . Dnc    . Laparoscopic tubal ligation Bilateral 07/31/2013    Procedure: LAPAROSCOPIC TUBAL LIGATION;  Surgeon: Allie BossierMyra C Dove, MD;  Location: [facility];  Service: Gynecology;  Laterality: Bilateral;   Family History  Problem Relation Age of Onset  . Diabetes Paternal Grandmother   . Heart disease Paternal Grandmother   . Hypertension Paternal Grandmother   . Other Neg Hx    History  Substance Use Topics  . Smoking status: Never Smoker   . Smokeless tobacco: Never Used  . Alcohol Use: No   OB  History    Gravida Para Term Preterm AB TAB SAB Ectopic Multiple Living   11 8 8  0 2 0 2 0 0 8     Review of Systems  All other systems reviewed and are negative.     Allergies  Review of patient's allergies indicates no known allergies.  Home Medications   Prior to Admission medications   Medication Sig Start Date End Date Taking? Authorizing Provider  amoxicillin-clavulanate (AUGMENTIN) 875-125 MG per tablet Take 1 tablet by mouth every 12 (twelve) hours. Patient not taking: Reported on 08/29/2014 07/27/13   Allie BossierMyra C Dove, MD  ferrous sulfate 325 (65 FE) MG tablet Take 1 tablet (325 mg total) by mouth 2 (two) times daily with a meal. Patient not taking: Reported on 08/29/2014 07/27/13   Allie BossierMyra C Dove, MD  FLUoxetine (PROZAC) 10 MG capsule Take 1 capsule (10 mg total) by mouth daily. Patient not taking: Reported on 08/29/2014 07/30/13   Shuvon Rankin, NP  ibuprofen (ADVIL,MOTRIN) 800 MG tablet Take 1 tablet (800 mg total) by mouth every 8 (eight) hours as needed for pain. Patient not taking: Reported on 08/29/2014 07/27/13   Allie BossierMyra C Dove, MD  oxyCODONE-acetaminophen (PERCOCET/ROXICET) 5-325 MG per tablet Take 1 tablet by  mouth every 4 (four) hours as needed for pain. Patient not taking: Reported on 08/29/2014 07/31/13   Allie BossierMyra C Dove, MD   BP 117/67 mmHg  Pulse 98  Temp(Src) 97.6 F (36.4 C) (Oral)  Resp 16  SpO2 98% Physical Exam  Constitutional: She is oriented to person, place, and time. She appears well-developed and well-nourished. No distress.  HENT:  Head: Normocephalic and atraumatic.  Eyes: EOM are normal.  Neck: Normal range of motion.  Cardiovascular: Normal rate, regular rhythm and normal heart sounds.   Pulmonary/Chest: Effort normal and breath sounds normal.  Abdominal: Soft. She exhibits no distension. There is no tenderness.  Musculoskeletal: Normal range of motion.  Neurological: She is alert and oriented to person, place, and time.  Skin: Skin is warm and  dry.  Psychiatric: She has a normal mood and affect. Judgment normal.  Nursing note and vitals reviewed.   ED Course  Procedures (including critical care time) Labs Review Labs Reviewed  CBC - Abnormal; Notable for the following:    Hemoglobin 10.7 (*)    HCT 33.5 (*)    All other components within normal limits  COMPREHENSIVE METABOLIC PANEL - Abnormal; Notable for the following:    Potassium 3.3 (*)    Glucose, Bld 158 (*)    All other components within normal limits    Imaging Review No results found.   EKG Interpretation   Date/Time:  Saturday August 29 2014 13:09:24 EST Ventricular Rate:  90 PR Interval:  159 QRS Duration: 81 QT Interval:  382 QTC Calculation: 467 R Axis:   68 Text Interpretation:  Sinus rhythm RSR' in V1 or V2, probably normal  variant No significant change was found Confirmed by Shaliah Wann  MD, Caryn BeeKEVIN  ([id number]) on 08/29/2014 3:34:27 PM      MDM   Final diagnoses:  None    [place] to [place] [employer] [place] [facility] who recommends the following: Charcoal 50 grams 1-2 liters of golytely Trend vitals 6 hours Cardiac monitor ecg       Lyanne CoKevin M Blakley Michna, MD 08/29/14 1534

## 2014-08-29 NOTE — Discharge Instructions (Signed)
Stimulant Use Disorder-Cocaine °Cocaine is one of a group of powerful drugs called stimulants. Cocaine has medical uses for stopping nosebleeds and for pain control before minor nose or dental surgery. However, cocaine is misused because of the effects that it produces. These effects include:  °· A feeling of extreme pleasure. °· Alertness. °· High energy. °Common street names for cocaine include coke, crack, blow, snow, and nose candy. Cocaine is snorted, dissolved in water and injected, or smoked.  °Stimulants are addictive because they activate regions of the brain that produce both the pleasurable sensation of "reward" and psychological dependence. Together, these actions account for loss of control and the rapid development of drug dependence. This means you become ill without the drug (withdrawal) and need to keep using it to function.  °Stimulant use disorder is use of stimulants that disrupts your daily life. It disrupts relationships with family and friends and how you do your job. Cocaine increases your blood pressure and heart rate. It can cause a heart attack or stroke. Cocaine can also cause death from irregular heart rate or seizures. °SYMPTOMS °Symptoms of stimulant use disorder with cocaine include: °· Use of cocaine in larger amounts or over a longer period of time than intended. °· Unsuccessful attempts to cut down or control cocaine use. °· A lot of time spent obtaining, using, or recovering from the effects of cocaine. °· A strong desire or urge to use cocaine (craving). °· Continued use of cocaine in spite of major problems at work, school, or home because of use. °· Continued use of cocaine in spite of relationship problems because of use. °· Giving up or cutting down on important life activities because of cocaine use. °· Use of cocaine over and over in situations when it is physically hazardous, such as driving a car. °· Continued use of cocaine in spite of a physical problem that is likely  related to use. Physical problems can include: °¨ Malnutrition. °¨ Nosebleeds. °¨ Chest pain. °¨ High blood pressure. °¨ A hole that develops between the part of your nose that separates your nostrils (perforated nasal septum). °¨ Lung and kidney damage. °· Continued use of cocaine in spite of a mental problem that is likely related to use. Mental problems can include: °¨ Schizophrenia-like symptoms. °¨ Depression. °¨ Bipolar mood swings. °¨ Anxiety. °¨ Sleep problems. °· Need to use more and more cocaine to get the same effect, or lessened effect over time with use of the same amount of cocaine (tolerance). °· Having withdrawal symptoms when cocaine use is stopped, or using cocaine to reduce or avoid withdrawal symptoms. Withdrawal symptoms include: °¨ Depressed or irritable mood. °¨ Low energy or restlessness. °¨ Bad dreams. °¨ Poor or excessive sleep. °¨ Increased appetite. °DIAGNOSIS °Stimulant use disorder is diagnosed by your health care provider. You may be asked questions about your cocaine use and how it affects your life. A physical exam may be done. A drug screen may be ordered. You may be referred to a mental health professional. The diagnosis of stimulant use disorder requires at least two symptoms within 12 months. The type of stimulant use disorder depends on the number of signs and symptoms you have. The type may be: °· Mild. Two or three signs and symptoms. °· Moderate. Four or five signs and symptoms. °· Severe. Six or more signs and symptoms. °TREATMENT °Treatment for stimulant use disorder is usually provided by mental health professionals with training in substance use disorders. The following options are available: °·   Counseling or talk therapy. Talk therapy addresses the reasons you use cocaine and ways to keep you from using again. Goals of talk therapy include:  Identifying and avoiding triggers for use.  Handling cravings.  Replacing use with healthy activities.  Support groups.  Support groups provide emotional support, advice, and guidance.  Medicine. Certain medicines may decrease cocaine cravings or withdrawal symptoms. HOME CARE INSTRUCTIONS  Take medicines only as directed by your health care provider.  Identify the people and activities that trigger your cocaine use and avoid them.  Keep all follow-up visits as directed by your health care provider. SEEK MEDICAL CARE IF:  Your symptoms get worse or you relapse.  You are not able to take medicines as directed. SEEK IMMEDIATE MEDICAL CARE IF:  You have serious thoughts about hurting yourself or others.  You have a seizure, chest pain, sudden weakness, or loss of speech or vision. FOR MORE INFORMATION  National Institute on Drug Abuse: http://www.price-smith.com/www.drugabuse.gov  Substance Abuse and Mental Health Services Administration: SkateOasis.com.ptwww.samhsa.gov Document Released: 09/15/2000 Document Revised: 02/02/2014 Document Reviewed: 10/01/2013 [facility] Patient Information 2015 [place], [place]. This information is not intended to replace advice given to you by your health care provider. Make sure you discuss any questions you have with your health care provider.    Emergency Department Resource Guide 1) Find a Doctor and Pay Out of Pocket Although you won't have to find out who is covered by your insurance plan, it is a good idea to ask around and get recommendations. You will then need to call the office and see if the doctor you have chosen will accept you as a new patient and what types of options they offer for patients who are self-pay. Some doctors offer discounts or will set up payment plans for their patients who do not have insurance, but you will need to ask so you aren't surprised when you get to your appointment.  2) Contact Your Local Health Department Not all health departments have doctors that can see patients for sick visits, but many do, so it is worth a call to see if yours does. If you don't know where your local  health department is, you can check in your phone book. The CDC also has a tool to help you locate your state's health department, and many state websites also have listings of all of their local health departments.  3) Find a Walk-in Clinic If your illness is not likely to be very severe or complicated, you may want to try a walk in clinic. These are popping up all over the country in pharmacies, drugstores, and shopping centers. They're usually staffed by nurse practitioners or physician assistants that have been trained to treat common illnesses and complaints. They're usually fairly quick and inexpensive. However, if you have serious medical issues or chronic medical problems, these are probably not your best option.  No Primary Care Doctor: - Call Health Connect at  [phone] - they can help you locate a primary care doctor that  accepts your insurance, provides certain services, etc. - Physician Referral Service- [phone]  Chronic Pain Problems: Organization         Address  Phone   Notes  [facility]  [phone]) [phone] Patients need to be referred by their primary care doctor.   Medication Assistance: Organization         Address  Phone   Notes  [facility] Medication Assistance [facility] [address] [place], [place]  [id number] [phone] --Must be a resident of [facility] -- Must have NO insurance coverage whatsoever (no Medicaid/ Medicare, etc.) -- The pt. MUST have a primary care doctor that directs their care regularly and follows them in the community   MedAssist  [phone]   [employer]  [phone]    Agencies that provide inexpensive medical care: Organization         Address  Phone   Notes  [facility]  [phone]   [facility]    [phone]) [phone]   [facility] [address] [place], [place] [address] [phone]   [facility] [address], [place] [phone]) [phone]   [facility]anned Parenthood    [phone]   [facility]    [phone]) [phone]   [facility]  [address], [place] Phone:  [phone], Fax:  [phone] Hours of Operation:  9 am - 6 pm, M-F.  Also accepts Medicaid/Medicare and self-pay.  [facility]  [address], [place] Phone: [phone], Fax: [phone]) [phone]. Hours of Operation:  8:30 am - 5:30 pm, M-F.  Also accepts Medicaid and self-pay.  [facility] [place] [address], [place] Phone: [phone]   [facility] [address], [place], [place] [phone], Ext. [phone] Mondays & Thursdays: 7-9 AM.  First 15 patients are seen on a first come, first serve basis.    Medicaid-accepting [facility] Providers:  Organization         Address  Phone   Notes  [facility] [address], [place] [phone] Also accepts self-pay patients.  [facility] [address], [place]  [phone]) [phone]   [facility] [address], [place] [phone]) [phone]   [facility] [address] [place] [address], [place] [phone]) [phone]   Renaye RakersVeita Bland [address], [place]   [phone] Only accepts [place] Access [place] patients after they have their name applied to their card.   Self-Pay (no insurance) in [facility]:  Organization         Address  Phone   Notes  Sickle Cell Patients, [facility] [address], [place] [phone]) [phone]   [facility] [address], [place] [phone]) [phone]   [facility] [place]  [address], [place] [phone]   [facility]/Dr. Osei-Bonsu  [address] [place] [address], [place] or [phone], [place] [phone]) [phone] Phone number for both [place] and  [place] locations is the same.  [facility] [address], [place] [phone]   [facility]  [address] [place] [address], [place] or [address] [phone]) [phone]   [facility] [address], [place] [phone]) [phone], phone; [phone]) [phone], fax Sees patients 1st and 3rd Saturday of every month.  Must not qualify for public or private insurance (i.e. Medicaid, Medicare, [place] Health Choice, Veterans' Benefits)  Household income should be no more than 200% of the poverty level The clinic cannot treat you if you are pregnant or think you are pregnant  Sexually transmitted diseases are not treated at the clinic.    Dental Care: Organization  Address  Phone  Notes  [facility] [address] [place], [place] [phone] Accepts children up to age 67 who are enrolled in [place] or [place] Health Choice; pregnant women with a Medicaid card; and children who have applied for Medicaid or [place] Health Choice, but were declined, whose parents can pay a reduced fee at time of service.  [facility] of [facility]  [address], [place] [phone] Accepts children up to age 3 who are enrolled in [place] or [place] Health Choice; pregnant women with a Medicaid card; and children who have applied for Medicaid or  Health Choice, but were declined, whose parents can pay a reduced fee at time of service.  [facility]  [address] [place], [place] [phone] Patients are seen by appointment only. Walk-ins are not accepted. [facility] will see patients 45 years of age and older. Monday - Tuesday (8am-5pm) Most Wednesdays (8:30-5pm) $30 per visit, cash only  [facility] Access PRO[facility]  [address], [facility] [phone] Patients are seen by appointment only. Walk-ins are not accepted.  [facility] will see patients 45 years of age and older. One Wednesday Evening (Monthly: Volunteer Based).  $30 per visit, cash only  [employer]  [phone] for adults; Children under age 35, call Graduate Pediatric Dentistry at [phone]. Children aged 45-14, please call [phone] to request a pediatric application.  Dental services are provided in all areas of dental care including fillings, crowns and bridges, complete and partial dentures, implants, gum treatment, root canals, and extractions. Preventive care is also provided. Treatment is provided to both adults and children. Patients are selected via a lottery and there is often a waiting list.   [facility] [address] [place]  [phone] [email or web address]   [facility] [address] [place], [place] [phone], Ext. [phone] Second and Fourth Thursday of each month, opens at 6:30 AM; Clinic ends at 9 AM.  Patients are seen on a first-come first-served basis, and a limited number are seen during each clinic.   [facility]  [address] [place], [place] [phone]   Eligibility Requirements You must have lived in [place], [place], or [place] counties for at least the last three months.   You cannot be eligible for state or federal sponsored [place], including [employer], [place], or [employer].   You generally cannot be eligible for healthcare insurance through your employer.    How to apply: Eligibility screenings are held every Tuesday and Wednesday afternoon from 1:00 pm until 4:00 pm. You do not need an appointment for the interview!  [facility] [address] [place], [place] [phone]   [facility]  [phone]   [facility]  [phone]   [facility] Department  [phone]    Behavioral Health Resources in the  Community: Intensive Outpatient Programs Organization         Address  Phone  Notes  [facility] [address], [place], [place] [phone]   [facility] [address] [place], [place] [phone]   [facility] [address] [place], [place]  [phone]   [facility] [address]  [place], [place] [phone] or [phone]   Substance Abuse Resources  Organization         Address  Phone  Notes  Alcohol [facility] Drug Services  [phone]   [facility]  [phone]   [facility] [place]  [phone]   Floydene Flock  [phone]   Residential & Outpatient Substance Abuse Program  [phone]   Psychological Services Organization         Address  Phone  Notes  [facility]  [phone]   [facility] Services  [phone]   [facility] [address] [place] [phone] or [phone]    Mobile Crisis Teams Organization         Address  Phone  Notes  [facility], Mobile Crisis Care Unit  [phone]   [facility]  [address] [place], [place] [phone]   Doristine Locks [address] [place] [phone]    Self-Help/Support Groups Organization         Address  Phone             Notes  [facility] [place] - variety of support groups  336- [phone] Call for more information  Narcotics Anonymous (NA), [facility] [address], [employer] [place]  2 meetings at this location   Statistician         Address  Phone  Notes  [facility] [address],    [place]  [phone]   [facility]  [address], [place] [address], [place], [place] [phone]   [facility] [address] [place], [place] [phone] Admissions: 8am-3pm M-F  [facility] [address]    [place], [place] [phone]   [facility] [address] [place], [place], [place] [phone]   [facility] [address]  [place], [place] [phone]   [facility] - Intensive Outpatient [address], [place], [place] [phone]   [facility] ([facility] [address] [place]  [place], [place] [phone] or [phone]   [facility] ([facility]) [address], [place], [place] [phone] Accepts Medicaid  [facility] [address],  [place] [phone] Substance Abuse/Addiction Treatment   [facility] Organization         Address  Phone  Notes  [facility]  [phone]   Angie Fava, PhD [address] Knack [place], [place]   [phone] or [phone]   [facility]   [address] [place], [place] [phone]   [facility] [address] [place], [place] [phone] Insurance/Medicaid/sponsorship through [facility] and Families [address]                                    [place], [place] [phone] Therapy/tele-psych/case  [facility] [address], [place] [phone]    Dr. Lolly Mustache  [phone]   [facility] [place] [facility]  [employer] [facility] Dept. 1) [address], [place] 2) [address], [place] 3)  [address], [place] [phone]  [phone]   [facility] Child Abuse Hotline 929-461-1480 or 6291322450 (After Hours)

## 2014-08-29 NOTE — ED Notes (Signed)
Pt now c/o headache and dizziness.

## 2014-08-29 NOTE — ED Provider Notes (Signed)
4:30 PM  Assumed care from Dr. Patria Maneampos.  Pt is a 28 y.o. F with history of bipolar disorder, substance abuse who presents emergency department with police after she intentionally ingested for exam of crack cocaine that were wrapped in plastic. She has been hemodynamically stable without complaints. Poison control has been consult. They would like the patient observed until 6:30 PM. If she is still asymptomatic, hemodynamically stable she will be discharged in police custody. She has received charcoal and GoLYTELY. Poison control recommended a rectal exam prior to discharge to see if there was any bags were present in the rectal vault.   6:50 PM  Pt is still hemodynamically stable without complaints of chest pain, abdominal pain. She has not yet had a bowel movement. She refuses to drink anymore GoLYTELY. Attempted to perform rectal exam but the patient is uncooperative. She does complain of a headache which she has had since arrival. We'll give Tylenol. I feel she is safe to be discharged in place custody.  Regina MawKristen N Ward, DO 08/29/14 1850

## 2014-08-29 NOTE — ED Notes (Signed)
Pt was sitting on her porch "about to smoke some crack".  "I saw dem cops rolling by so I ran".  States that she hid underneath a house "right beside a dead cat" and swallowed 4 $20 bags of crack cocaine.  Pt only complaining of bug bites from being under the house and LLQ abd pain x 1 year after a miscarriage.

## 2014-08-29 NOTE — ED Notes (Signed)
She admits to attempting to evade police by crawling "under a house".  She states that, while under the house she attempted to swallow 4 "$20 bags of crack".  She states she swallowed 3 bags, but she thinks the 4th bag "slipped out and I don't think I swallowed that one".

## 2014-08-29 NOTE — ED Notes (Signed)
She is awake, alert and oriented x 4 with clear speech.  She is speaking amiably with a [place] Officer, who remains in constant attendance.

## 2014-10-15 ENCOUNTER — Encounter (HOSPITAL_COMMUNITY): Payer: Self-pay | Admitting: Obstetrics & Gynecology

## 2014-10-23 IMAGING — US US OB LIMITED
1 series · 13 of 28 positions shown · non-contrast
Comparison: none

[Series 1: us ob comp less 14 wks · 39 acquisitions, 13 frames shown]
[im 2/39]
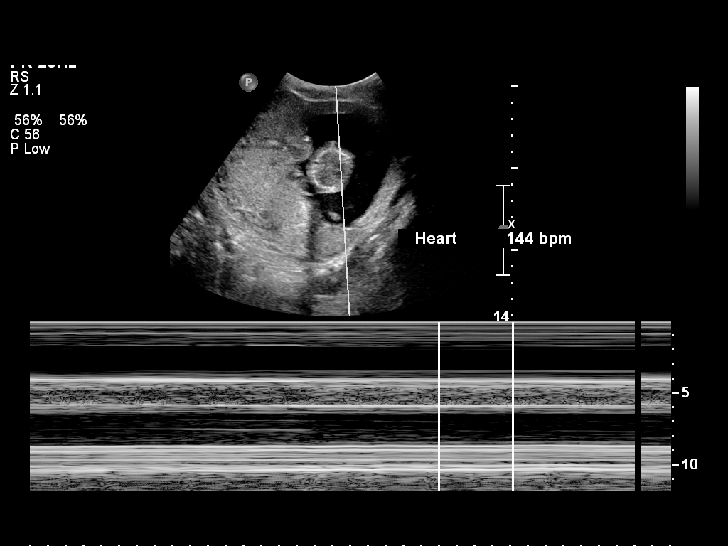
[im 5/39]
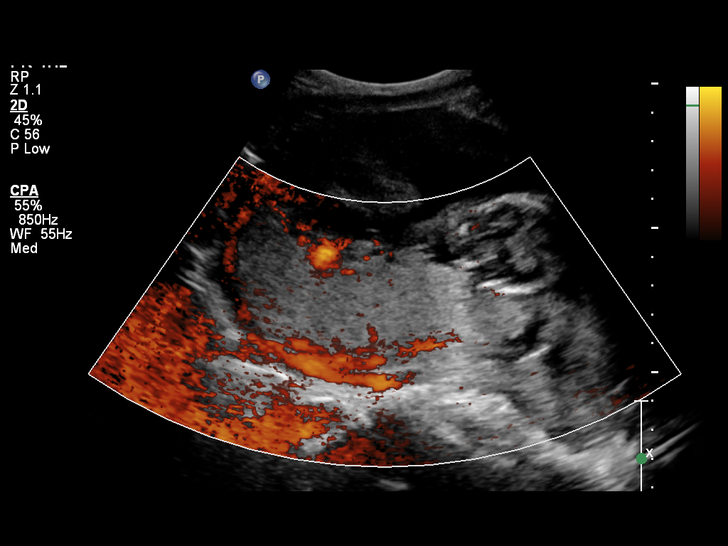
[im 8/39]
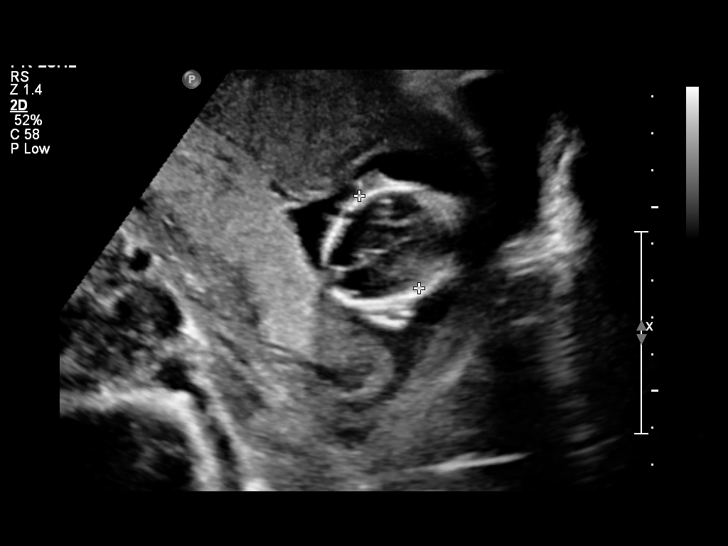
[im 10/39]
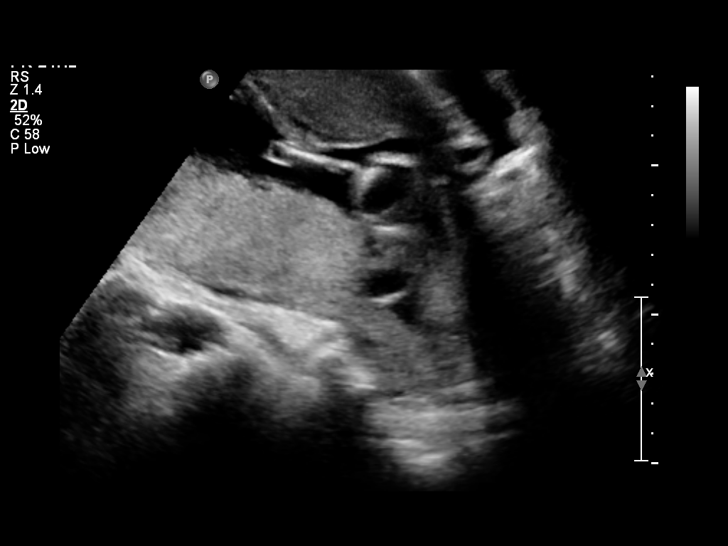
[im 13/39]
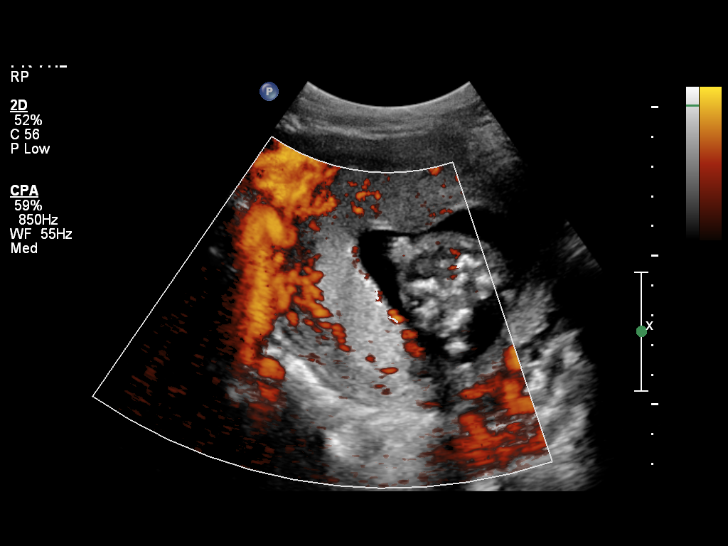
[im 16/39]
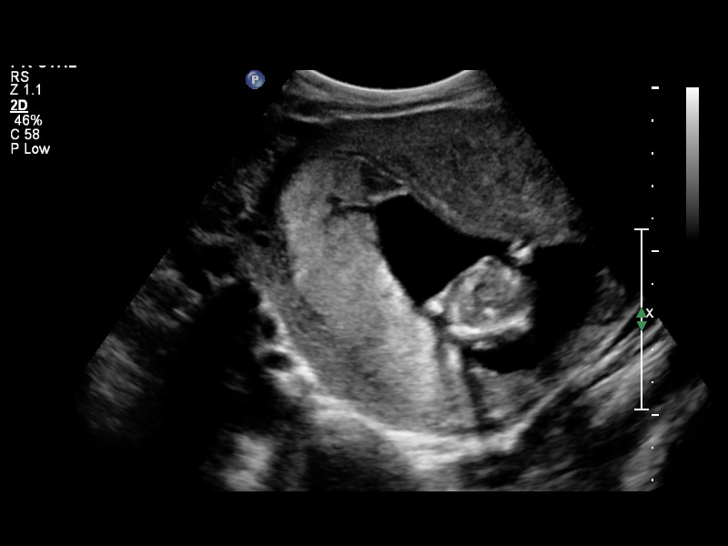
[im 20/39]
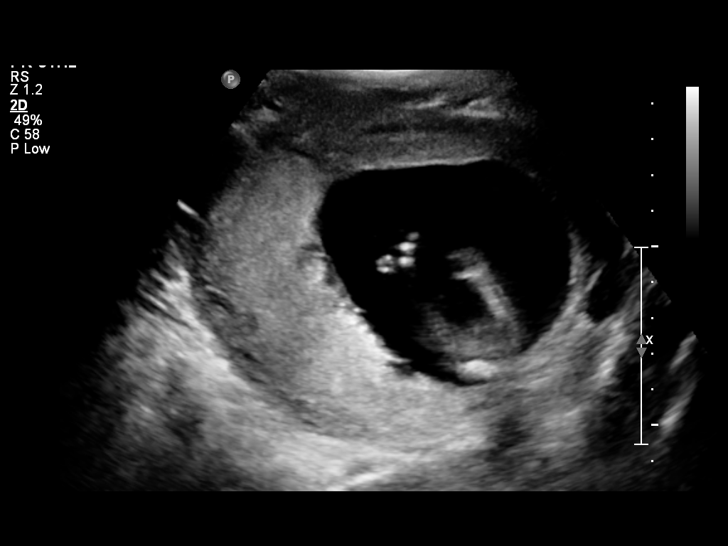
[im 23/39]
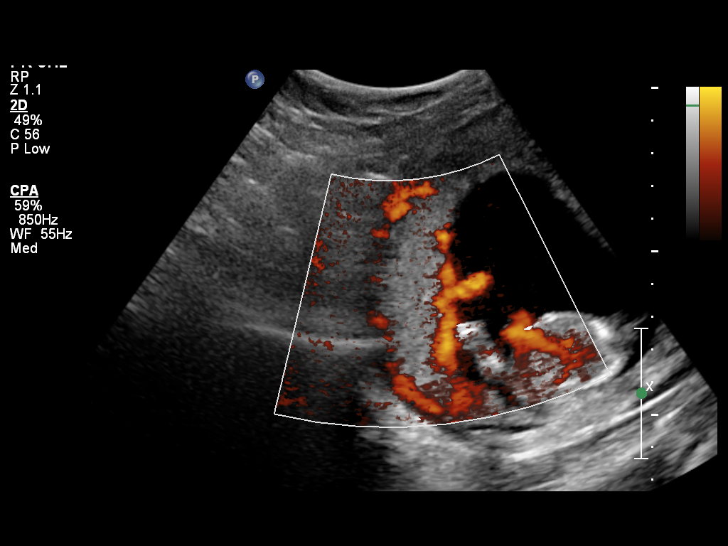
[im 26/39]
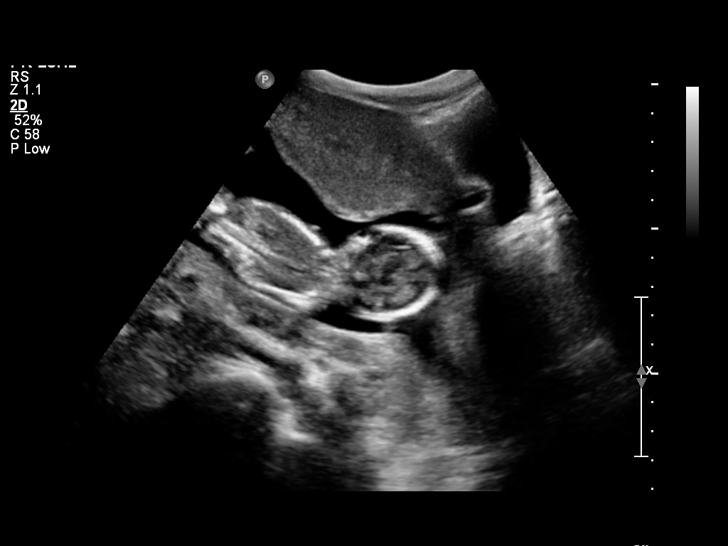
[im 29/39]
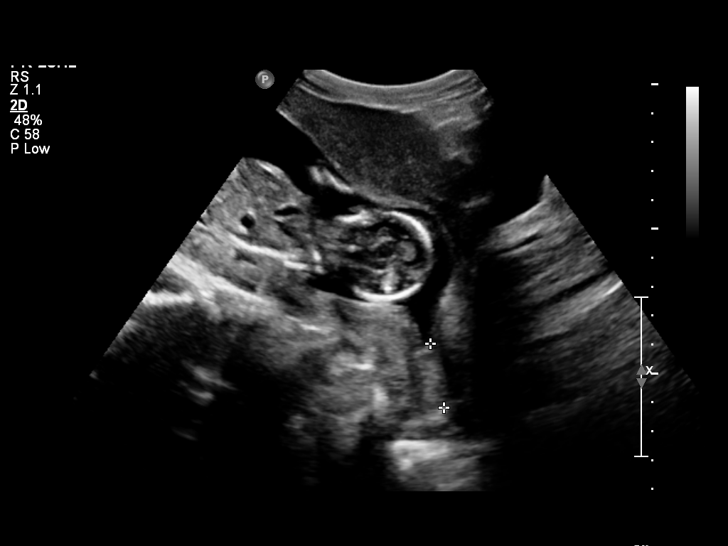
[im 31/39]
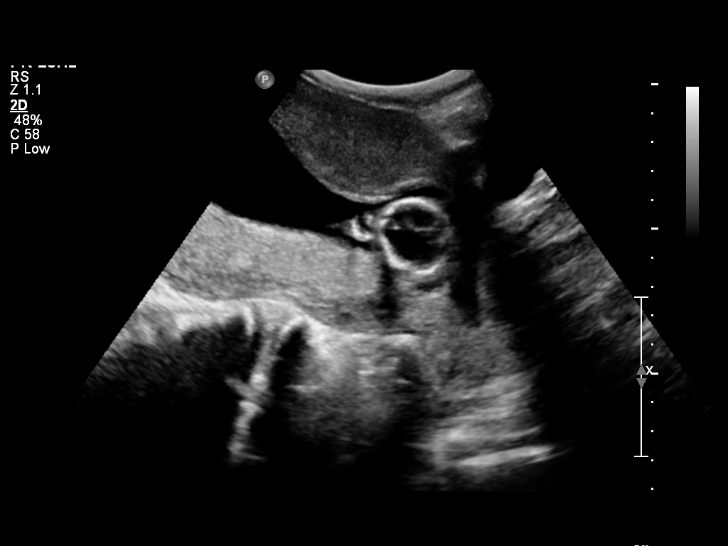
[im 34/39]
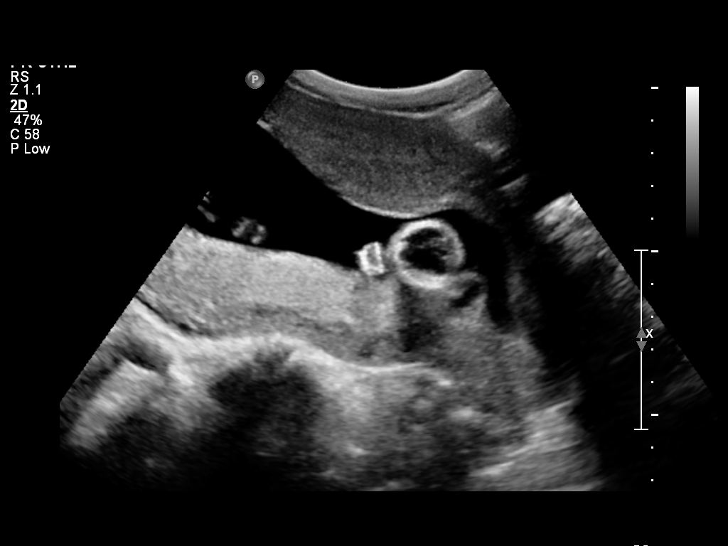
[im 37/39]
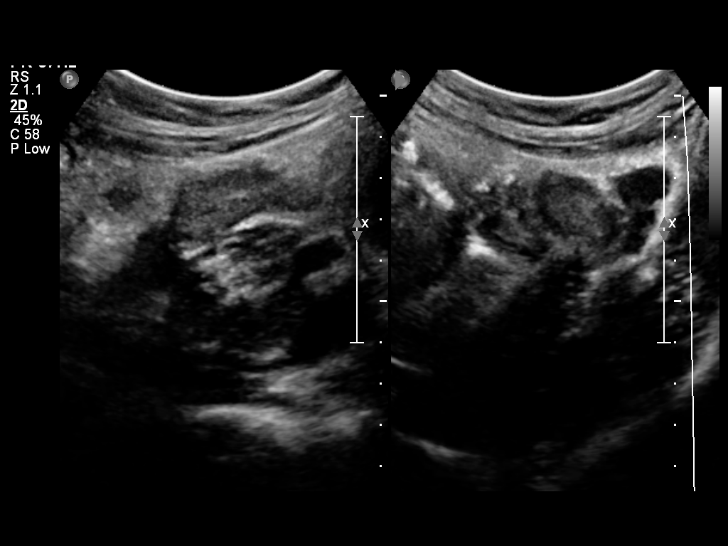

[13 of 28 positions shown; findings below may reference images not displayed]

OBSTETRICS REPORT
                      (Signed Final 07/16/2013 [DATE])

Service(s) Provided

 [HOSPITAL]                                         76815.0
Indications

 Vaginal bleeding, unknown etiology
 No or Little Prenatal Care
 Cocaine abuse
 Herpes simplex virus (LESYA)
 Bipolar disorder
 History of Depression/Anxiety

Fetal Evaluation

 Num Of Fetuses:    1
 Fetal Heart Rate:  144                          bpm
 Cardiac Activity:  Observed
 Presentation:      Cephalic
 Placenta:          Marginal Previa
 P. Cord            Visualized
 Insertion:

 Amniotic Fluid
 AFI FV:      Subjectively within normal limits
                                             Larg Pckt:     5.9  cm
Biometry

 CRL:    102.2  mm     G. Age:  N/A                    EDD:

 BPD:     30.2  mm     G. Age:  15w 4d
Gestational Age

 U/S Today:     15w 4d                                        EDD:   01/03/14
 Best:          15w 4d     Det. By:  U/S (07/16/13)           EDD:   01/03/14
Anatomy

 Cranium:          Appears normal         Lower             Visualized
                                          Extremities:
 Choroid Plexus:   Appears normal         Upper             Visualized
                                          Extremities:
 Abdomen:          Appears normal
Cervix Uterus Adnexa

 Cervical Length:    2.3      cm       Funnel Width:   1.2       cm
 Funnel Length:      2.2      cm

 Cervix:       Appears funnelled, see comments.

 Left Ovary:    Size(cm) L: 3.15 x W: 2.06 x H: 1.51  Volume(cc):
 Right Ovary:   Size(cm) L: 3.62 x W: 2.65 x H: 2.07  Volume(cc):


 Adnexa:     No abnormality visualized.
Comments

 Today's ultrasound shows a fluid collection near the inferior
 edge of the placenta.  This is consistent with a marginal
 abruption of the posterior placenta.
 Cervical funneling is seen with a residual functional length of
 2.3c
Impression

 Single living intrauterine pregnancy at 15 weeks 4 days.
 Suspected marginal placental abruption.
Recommendations

 Follow-up ultrasounds as clinically indicated.

                Riaz, Shirleen

## 2015-03-11 ENCOUNTER — Encounter (HOSPITAL_COMMUNITY): Payer: Self-pay | Admitting: Obstetrics & Gynecology

## 2016-02-07 ENCOUNTER — Emergency Department (HOSPITAL_COMMUNITY)
Admission: EM | Admit: 2016-02-07 | Discharge: 2016-02-07 | Disposition: A | Discharge status: Home / Self Care | Payer: Self-pay | Attending: Emergency Medicine | Admitting: Emergency Medicine

## 2016-02-07 ENCOUNTER — Encounter (HOSPITAL_COMMUNITY): Payer: Self-pay | Admitting: Emergency Medicine

## 2016-02-07 ENCOUNTER — Emergency Department (HOSPITAL_COMMUNITY): Payer: Self-pay

## 2016-02-07 DIAGNOSIS — Z8619 Personal history of other infectious and parasitic diseases: Secondary | ICD-10-CM | POA: Insufficient documentation

## 2016-02-07 DIAGNOSIS — D649 Anemia, unspecified: Secondary | ICD-10-CM | POA: Insufficient documentation

## 2016-02-07 DIAGNOSIS — Z87828 Personal history of other (healed) physical injury and trauma: Secondary | ICD-10-CM | POA: Insufficient documentation

## 2016-02-07 DIAGNOSIS — Z3202 Encounter for pregnancy test, result negative: Secondary | ICD-10-CM | POA: Insufficient documentation

## 2016-02-07 DIAGNOSIS — R079 Chest pain, unspecified: Secondary | ICD-10-CM | POA: Insufficient documentation

## 2016-02-07 DIAGNOSIS — Z8659 Personal history of other mental and behavioral disorders: Secondary | ICD-10-CM | POA: Insufficient documentation

## 2016-02-07 LAB — CBC
HCT: 36.8 % (ref 36.0–46.0)
Hemoglobin: 12.3 g/dL (ref 12.0–15.0)
MCH: 29.3 pg (ref 26.0–34.0)
MCHC: 33.4 g/dL (ref 30.0–36.0)
MCV: 87.6 fL (ref 78.0–100.0)
PLATELETS: 342 10*3/uL (ref 150–400)
RBC: 4.2 MIL/uL (ref 3.87–5.11)
RDW: 12.9 % (ref 11.5–15.5)
WBC: 8.2 10*3/uL (ref 4.0–10.5)

## 2016-02-07 LAB — I-STAT TROPONIN, ED
TROPONIN I, POC: 0 ng/mL (ref 0.00–0.08)
TROPONIN I, POC: 0 ng/mL (ref 0.00–0.08)

## 2016-02-07 LAB — BASIC METABOLIC PANEL
Anion gap: 9 (ref 5–15)
BUN: 11 mg/dL (ref 6–20)
CO2: 21 mmol/L — ABNORMAL LOW (ref 22–32)
CREATININE: 0.72 mg/dL (ref 0.44–1.00)
Calcium: 8.9 mg/dL (ref 8.9–10.3)
Chloride: 107 mmol/L (ref 101–111)
GFR calc non Af Amer: >60 mL/min (ref >60)
Glucose, Bld: 97 mg/dL (ref 65–99)
Potassium: 3.6 mmol/L (ref 3.5–5.1)
SODIUM: 137 mmol/L (ref 135–145)

## 2016-02-07 LAB — I-STAT BETA HCG BLOOD, ED (MC, WL, AP ONLY)

## 2016-02-07 NOTE — ED Notes (Signed)
Pt released to police custody due to parole violation

## 2016-02-07 NOTE — ED Notes (Addendum)
Pt brought in by EMS with police c/o center chest pain x 2 days and cocaine use. Pt admits to daily cocaine use. Pt reports is in police custody for probation violation. Pt given 324 mg of ASA by EMS

## 2016-02-07 NOTE — ED Provider Notes (Signed)
CSN: [id number]     Arrival date & time 02/07/16  0845 History   First MD Initiated Contact with Patient 02/07/16 [phone]     Chief Complaint  Patient presents with  . Chest Pain     (Consider location/radiation/quality/duration/timing/severity/associated sxs/prior Treatment) Patient is a 30 y.o. female presenting with chest pain.  Chest Pain Pain location:  Substernal area Pain quality: aching   Pain radiates to:  Does not radiate Pain radiates to the back: no   Pain severity:  Moderate Onset quality:  Sudden Duration: "last night sometime" Timing:  Intermittent Progression:  Waxing and waning Chronicity:  New Context: drug use (smoking crack)   Relieved by:  Nothing Worsened by:  Nothing tried Ineffective treatments:  None tried Associated symptoms: numbness (both hands) and shortness of breath ("a little")   Associated symptoms: no abdominal pain, no back pain, no cough, no fever, no headache and not vomiting   Risk factors: no coronary artery disease, no diabetes mellitus and no prior DVT/PE     Past Medical History  Diagnosis Date  . Genital herpes     Also Chlamydia, GC, HPV and Trich  . Mental disorder   . Bipolar 2 disorder (HCC)     pt says r/o bipolar.  . ADHD (attention deficit hyperactivity disorder)   . Abnormal Pap smear     during preg, will retest after  . Abnormal Pap smear and cervical HPV (human papillomavirus)   . Anemia   . Depression   . Anxiety   . Cocaine abuse   . Head injury     hematoma on left back of head hit with crow bar   Past Surgical History  Procedure Laterality Date  . No past surgeries    . Dilation and evacuation N/A 07/26/2013    Procedure: DILATATION AND EVACUATION (D&E) 2ND TRIMESTER. with ultrasound;  Surgeon: Tereso Newcomer, MD;  Location: [facility];  Service: Obstetrics;  Laterality: N/A;  . Dnc    . Laparoscopic tubal ligation Bilateral 07/31/2013    Procedure: LAPAROSCOPIC TUBAL LIGATION;  Surgeon: Allie Bossier, MD;   Location: [facility];  Service: Gynecology;  Laterality: Bilateral;   Family History  Problem Relation Age of Onset  . Diabetes Paternal Grandmother   . Heart disease Paternal Grandmother   . Hypertension Paternal Grandmother   . Other Neg Hx    Social History  Substance Use Topics  . Smoking status: Never Smoker   . Smokeless tobacco: Never Used  . Alcohol Use: No   OB History    Gravida Para Term Preterm AB TAB SAB Ectopic Multiple Living   0 2 0 2 0 0 8     Review of Systems  Constitutional: Negative for fever.  HENT: Negative for sore throat.   Eyes: Negative for visual disturbance.  Respiratory: Positive for shortness of breath ("a little"). Negative for cough.   Cardiovascular: Positive for chest pain.  Gastrointestinal: Negative for vomiting and abdominal pain.  Genitourinary: Negative for difficulty urinating.  Musculoskeletal: Negative for back pain and neck pain.  Skin: Negative for rash.  Neurological: Positive for numbness (both hands). Negative for syncope and headaches.      Allergies  Review of patient's allergies indicates no known allergies.  Home Medications   Prior to Admission medications   Medication Sig Start Date End Date Taking? Authorizing Provider  ferrous sulfate 325 (65 FE) MG tablet Take 1 tablet (325 mg total) by mouth 2 (two) times daily  with a meal. Patient not taking: Reported on 08/29/2014 07/27/13   Allie Bossier, MD  FLUoxetine (PROZAC) 10 MG capsule Take 1 capsule (10 mg total) by mouth daily. Patient not taking: Reported on 08/29/2014 07/30/13   Shuvon B Rankin, NP  ibuprofen (ADVIL,MOTRIN) 800 MG tablet Take 1 tablet (800 mg total) by mouth every 8 (eight) hours as needed for pain. Patient not taking: Reported on 08/29/2014 07/27/13   Allie Bossier, MD  oxyCODONE-acetaminophen (PERCOCET/ROXICET) 5-325 MG per tablet Take 1 tablet by mouth every 4 (four) hours as needed for pain. Patient not taking: Reported on 08/29/2014 07/31/13    Allie Bossier, MD   BP 102/64 mmHg  Pulse 67  Temp(Src) 98.4 F (36.9 C) (Oral)  Resp 21  Ht 5\' 4"  (1.626 m)  SpO2 100%  LMP 01/12/2016  Breastfeeding? No Physical Exam  Constitutional: She is oriented to person, place, and time. She appears well-developed and well-nourished. No distress.  HENT:  Head: Normocephalic and atraumatic.  Eyes: Conjunctivae and EOM are normal.  Neck: Normal range of motion.  Cardiovascular: Normal rate, regular rhythm, normal heart sounds and intact distal pulses.  Exam reveals no gallop and no friction rub.   No murmur heard. Pulmonary/Chest: Effort normal and breath sounds normal. No respiratory distress. She has no wheezes. She has no rales. She exhibits tenderness.  Abdominal: Soft. She exhibits no distension. There is no tenderness. There is no guarding.  Musculoskeletal: She exhibits no edema or tenderness.  Neurological: She is alert and oriented to person, place, and time.  Skin: Skin is warm and dry. No rash noted. She is not diaphoretic. No erythema.  Nursing note and vitals reviewed.   ED Course  Procedures (including critical care time) Labs Review Labs Reviewed  BASIC METABOLIC PANEL - Abnormal; Notable for the following:    CO2 21 (*)    All other components within normal limits  CBC  I-STAT TROPOININ, ED  I-STAT BETA HCG BLOOD, ED ([facility], [facility], [facility] ONLY)  I-STAT TROPOININ, ED    Imaging Review Dg Chest 2 View  02/07/2016  CLINICAL DATA:  Tachycardia following cocaine use earlier today EXAM: CHEST  2 VIEW COMPARISON:  None. FINDINGS: Lungs are clear. The heart size and pulmonary vascularity are normal. No adenopathy. No pneumothorax or pneumomediastinum. There is upper lumbar levoscoliosis. IMPRESSION: No edema or consolidation. Electronically Signed   By: Bretta Bang III M.D.   On: 02/07/2016 09:35   I have personally reviewed and evaluated these images and lab results as part of my medical decision-making.   EKG  Interpretation   Date/Time:  Monday Feb 07 2016 08:49:46 EDT Ventricular Rate:  59 PR Interval:  150 QRS Duration: 82 QT Interval:  412 QTC Calculation: 408 R Axis:   72 Text Interpretation:  Sinus rhythm No significant change since last  tracing Confirmed by [facility] MD, Jamesina Gaugh ([id number]) on 02/07/2016 8:58:42 AM      MDM   Final diagnoses:  Chest pain, unspecified chest pain type   30 year old female with history of bipolar and substance abuse presents in police custody for violating probation with concern for chest pain after using crack cocaine. EKG shows no acute changes. Initial troponins negative. Given intermittent nature, sudden troponin was done which was also negative. Low suspicion for pulmonary embolus, not tachycardic, not hypoxic, not tachypnea, and patient's having chest pain in the setting of doing crack cocaine. Chest pain likely secondary to cocaine use. Recommend discontinuing cocaine and PCP follow-up. Patient  discharged in stable condition with understanding of reasons to return.   Alvira MondayErin Babara [place], MD 02/07/16 2210

## 2016-10-28 ENCOUNTER — Emergency Department (HOSPITAL_COMMUNITY)
Admission: EM | Admit: 2016-10-28 | Discharge: 2016-10-28 | Disposition: A | Discharge status: Home / Self Care | Payer: Self-pay | Attending: Emergency Medicine | Admitting: Emergency Medicine

## 2016-10-28 ENCOUNTER — Encounter (HOSPITAL_COMMUNITY): Payer: Self-pay

## 2016-10-28 DIAGNOSIS — F909 Attention-deficit hyperactivity disorder, unspecified type: Secondary | ICD-10-CM | POA: Insufficient documentation

## 2016-10-28 DIAGNOSIS — K0889 Other specified disorders of teeth and supporting structures: Secondary | ICD-10-CM | POA: Insufficient documentation

## 2016-10-28 DIAGNOSIS — R202 Paresthesia of skin: Secondary | ICD-10-CM | POA: Insufficient documentation

## 2016-10-28 DIAGNOSIS — Z79899 Other long term (current) drug therapy: Secondary | ICD-10-CM | POA: Insufficient documentation

## 2016-10-28 LAB — CBG MONITORING, ED: Glucose-Capillary: 91 mg/dL (ref 65–99)

## 2016-10-28 MED ORDER — ACETAMINOPHEN 500 MG PO TABS: 1000.0000 mg | ORAL_TABLET | Freq: Four times a day (QID) | ORAL | 0 refills | Status: AC | PRN

## 2016-10-28 MED ORDER — IBUPROFEN 800 MG PO TABS: 800.0000 mg | ORAL_TABLET | Freq: Three times a day (TID) | ORAL | 0 refills | Status: AC

## 2016-10-28 MED ORDER — PENICILLIN V POTASSIUM 500 MG PO TABS: 500.0000 mg | ORAL_TABLET | Freq: Four times a day (QID) | ORAL | 0 refills | Status: AC

## 2016-10-28 MED ORDER — ACETAMINOPHEN 500 MG PO TABS
1000.0000 mg | ORAL_TABLET | Freq: Once | ORAL | Status: AC
  Administered 2016-10-28: 1000 mg via ORAL
  Filled 2016-10-28: qty 2

## 2016-10-28 NOTE — ED Provider Notes (Signed)
[facility]-EM[facility]ENCY DEPT Provider Note   CSN: [id number] Arrival date & time: 10/28/16  1191  By signing my name below, I, Orpah Cobb, attest that this documentation has been prepared under the direction and in the presence of Buel Ream, PA-C. Electronically Signed: Orpah Cobb , ED Scribe. 10/16/16. 4:20 PM.    History   Chief Complaint Chief Complaint  Patient presents with  . Dental Problem    HPI  Regina Coleman is a 31 y.o. female with hx of cocaine abuse, carpal tunnel who presents to the Emergency Department complaining of mild to moderate L upper dental pain with sudden onset x2 days. Pt states that she has no teeth where the pain is located but reports having chronic dental problems due to drug abuse. Pt also reports intermittent tingling in bilateral hands. Pt reports nausea, increased thirst. She has taken Ibuprofen for the pain with no relief. Pt denies numbness, vomiting, abdominal pain, neck pain, urinary symptoms. Of note, pt states she does not use drugs anymore and reports last use of cocaine on May 8th. Pt reports family hx of diabetes mellitus. She denies hx of diabetes mellitus. 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 Pt denies allergies to medication.   The history is provided by the patient. No language interpreter was used.    Past Medical History:  Diagnosis Date  . Abnormal Pap smear    during preg, will retest after  . Abnormal Pap smear and cervical HPV (human papillomavirus)   . ADHD (attention deficit hyperactivity disorder)   . Anemia   . Anxiety   . Bipolar 2 disorder (HCC)    pt says r/o bipolar.  . Cocaine abuse   . Depression   . Genital herpes    Also Chlamydia, GC, HPV and Trich  . Head injury    hematoma on left back of head hit with crow bar  . Mental disorder     Patient Active Problem List   Diagnosis Date Noted  . Consultation for female sterilization 07/30/2013  . Placental abruption in second trimester 07/26/2013  .  Fetal demise at 17 weeks 07/26/2013  . Cocaine dependence 07/26/2013  . High risk sexual behavior 07/16/2013  . Cocaine dependence complicating pregnancy 10/11/2012    Past Surgical History:  Procedure Laterality Date  . DILATION AND EVACUATION N/A 07/26/2013   Procedure: DILATATION AND EVACUATION (D&E) 2ND TRIMESTER. with ultrasound;  Surgeon: Tereso Newcomer, MD;  Location: [facility];  Service: Obstetrics;  Laterality: N/A;  . DNC    . LAPAROSCOPIC TUBAL LIGATION Bilateral 07/31/2013   Procedure: LAPAROSCOPIC TUBAL LIGATION;  Surgeon: Allie Bossier, MD;  Location: [facility];  Service: Gynecology;  Laterality: Bilateral;  . NO PAST SURGERIES      OB History    Gravida Para Term Preterm AB Living   11 8 8  0 2 8   SAB TAB Ectopic Multiple Live Births   2 0 0 0 2       Home Medications    Prior to Admission medications   Medication Sig Start Date End Date Taking? Authorizing Provider  acetaminophen (TYLENOL) 500 MG tablet Take 2 tablets (1,000 mg total) by mouth every 6 (six) hours as needed. 10/28/16   Emi Holes, PA-C  ferrous sulfate 325 (65 FE) MG tablet Take 1 tablet (325 mg total) by mouth 2 (two) times daily with a meal. Patient not taking: Reported on 08/29/2014 07/27/13   Allie Bossier, MD  FLUoxetine (PROZAC) 10 MG capsule  Take 1 capsule (10 mg total) by mouth daily. Patient not taking: Reported on 08/29/2014 07/30/13   Shuvon B Rankin, NP  ibuprofen (ADVIL,MOTRIN) 800 MG tablet Take 1 tablet (800 mg total) by mouth 3 (three) times daily. 10/28/16   Emi Holes, PA-C  oxyCODONE-acetaminophen (PERCOCET/ROXICET) 5-325 MG per tablet Take 1 tablet by mouth every 4 (four) hours as needed for pain. Patient not taking: Reported on 08/29/2014 07/31/13   Allie Bossier, MD  penicillin v potassium (VEETID) 500 MG tablet Take 1 tablet (500 mg total) by mouth 4 (four) times daily. 10/28/16 11/04/16  Emi Holes, PA-C    Family History Family History  Problem Relation Age of Onset    . Diabetes Paternal Grandmother   . Heart disease Paternal Grandmother   . Hypertension Paternal Grandmother   . Other Neg Hx     Social History Social History  Substance Use Topics  . Smoking status: Never Smoker  . Smokeless tobacco: Never Used  . Alcohol use No     Allergies   Patient has no known allergies.   Review of Systems Review of Systems  HENT: Positive for dental problem.   Gastrointestinal: Positive for nausea. Negative for abdominal pain and vomiting.  Genitourinary: Negative for dysuria and hematuria.  Musculoskeletal: Negative for neck pain.  Neurological: Negative for numbness.       Tingling in bilateral hands     Physical Exam Updated Vital Signs BP 124/80 (BP Location: Left Arm)   Pulse 71   Temp 98 F (36.7 C) (Oral)   Resp 18   SpO2 100%   Physical Exam  Constitutional: She appears well-developed and well-nourished. No distress.  HENT:  Head: Normocephalic and atraumatic.  Mouth/Throat: Oropharynx is clear and moist. Normal dentition. Dental caries present. No dental abscesses. No oropharyngeal exudate, posterior oropharyngeal edema or posterior oropharyngeal erythema.    Eyes: Conjunctivae are normal. Pupils are equal, round, and reactive to light. Right eye exhibits no discharge. Left eye exhibits no discharge. No scleral icterus.  Neck: Normal range of motion. Neck supple. No thyromegaly present.  Cardiovascular: Normal rate, regular rhythm, normal heart sounds and intact distal pulses.  Exam reveals no gallop and no friction rub.   No murmur heard. Pulmonary/Chest: Effort normal and breath sounds normal. No stridor. No respiratory distress. She has no wheezes. She has no rales.  Abdominal: Soft. Bowel sounds are normal. She exhibits no distension. There is no tenderness. There is no rebound and no guarding.  Musculoskeletal: She exhibits no edema.  No tenderness to palpation to cervical spine or upper trapezius muscles   Lymphadenopathy:    She has no cervical adenopathy.  Neurological: She is alert. Coordination normal.  Normal sensation throughout except for paresthesias to bilateral hands, radial pulses intact, equal bilateral grip strength  Skin: Skin is warm and dry. No rash noted. She is not diaphoretic. No pallor.  Psychiatric: She has a normal mood and affect.  Nursing note and vitals reviewed.    ED Treatments / Results   DIAGNOSTIC STUDIES: Oxygen Saturation is 100% on RA, normal by my interpretation.   COORDINATION OF CARE: 12:24 PM-Discussed next steps with pt. Pt verbalized understanding and is agreeable with the plan.    Labs (all labs ordered are listed, but only abnormal results are displayed) Labs Reviewed  CBG MONITORING, ED    EKG  EKG Interpretation None       Radiology No results found.  Procedures Procedures (including critical care time)  Medications Ordered in ED Medications  acetaminophen (TYLENOL) tablet 1,000 mg (1,000 mg Oral Given 10/28/16 1032)     Initial Impression / Assessment and Plan / ED Course  I have reviewed the triage vital signs and the nursing notes.  Pertinent labs & imaging results that were available during my care of the patient were reviewed by me and considered in my medical decision making (see chart for details).     Patient with dentalgia.  Pain improved significantly with Tylenol in the ED. No abscess requiring immediate incision and drainage.  Exam not concerning for Ludwig's angina or pharyngeal abscess.  Will treat with Penicillin, ibuprofen, Tylenol. Patient also with paresthesias to bilateral hands. Doubts emergent pathology. Negative Tinel's and Phalen's. CBG 91. Pt instructed to follow-up with dentist and establish care with PCP for further evaluation of hand paresthesias.  Discussed return precautions. Patient understands and agrees with plan. Patient vitals stable throughout ED course and discharged in satisfactory  condition. I discussed patient case with Dr. Dalene SeltzerSchlossman who got patient's management and agrees with plan.  Final Clinical Impressions(s) / ED Diagnoses   Final diagnoses:  Pain, dental  Paresthesia of hand, bilateral    New Prescriptions Discharge Medication List as of 10/28/2016 10:55 AM    START taking these medications   Details  acetaminophen (TYLENOL) 500 MG tablet Take 2 tablets (1,000 mg total) by mouth every 6 (six) hours as needed., Starting Sat 10/28/2016, Print    penicillin v potassium (VEETID) 500 MG tablet Take 1 tablet (500 mg total) by mouth 4 (four) times daily., Starting Sat 10/28/2016, Until Sat 11/04/2016, Print       I personally performed the services described in this documentation, which was scribed in my presence. The recorded information has been reviewed and is accurate.     Emi Holeslexandra M Nancie Bocanegra, PA-C 10/28/16 1226    Alvira MondayErin Schlossman, MD 10/31/16 [phone]

## 2016-10-28 NOTE — ED Triage Notes (Signed)
Patient here with gum pain to left upper side of mouth for a few days. Also has bilateral hand intermittent tingling for 1-2 months

## 2016-10-28 NOTE — Discharge Instructions (Signed)
Medications: Penicillin, ibuprofen, Tylenol  Treatment: Take penicillin 4 times daily for 1 week. Make sure to finish all this medication. Take ibuprofen every 8 hours as needed for your pain. You can alternate every 6 hours with Tylenol.  Follow-up: Please follow-up and establish care with a primary care provider by calling the number below or the number circled below your discharge paperwork. Please follow-up with a dentist as soon as possible for further evaluation and treatment of your gum pain. There are resources attached.

## 2020-11-01 DIAGNOSIS — Z0389 Encounter for observation for other suspected diseases and conditions ruled out: Secondary | ICD-10-CM | POA: Diagnosis not present

## 2020-11-01 DIAGNOSIS — Z3009 Encounter for other general counseling and advice on contraception: Secondary | ICD-10-CM | POA: Diagnosis not present

## 2020-11-01 DIAGNOSIS — Z1388 Encounter for screening for disorder due to exposure to contaminants: Secondary | ICD-10-CM | POA: Diagnosis not present

## 2021-10-20 DIAGNOSIS — Z0389 Encounter for observation for other suspected diseases and conditions ruled out: Secondary | ICD-10-CM | POA: Diagnosis not present

## 2021-10-20 DIAGNOSIS — Z3009 Encounter for other general counseling and advice on contraception: Secondary | ICD-10-CM | POA: Diagnosis not present

## 2021-10-20 DIAGNOSIS — Z1388 Encounter for screening for disorder due to exposure to contaminants: Secondary | ICD-10-CM | POA: Diagnosis not present
